# Patient Record
Sex: Male | Born: 1955 | Race: White | Hispanic: No | Marital: Single | Smoking: Never smoker
Health system: Southern US, Community
[De-identification: ages and names within clinical notes are randomized; demographics above are authoritative.]

## PROBLEM LIST (undated history)

## (undated) DIAGNOSIS — R739 Hyperglycemia, unspecified: Secondary | ICD-10-CM

## (undated) DIAGNOSIS — G40309 Generalized idiopathic epilepsy and epileptic syndromes, not intractable, without status epilepticus: Secondary | ICD-10-CM

## (undated) DIAGNOSIS — C210 Malignant neoplasm of anus, unspecified: Secondary | ICD-10-CM

## (undated) DIAGNOSIS — M810 Age-related osteoporosis without current pathological fracture: Secondary | ICD-10-CM

## (undated) DIAGNOSIS — E559 Vitamin D deficiency, unspecified: Secondary | ICD-10-CM

## (undated) DIAGNOSIS — I82409 Acute embolism and thrombosis of unspecified deep veins of unspecified lower extremity: Secondary | ICD-10-CM

## (undated) DIAGNOSIS — G40409 Other generalized epilepsy and epileptic syndromes, not intractable, without status epilepticus: Secondary | ICD-10-CM

## (undated) DIAGNOSIS — I10 Essential (primary) hypertension: Secondary | ICD-10-CM

## (undated) DIAGNOSIS — D649 Anemia, unspecified: Secondary | ICD-10-CM

## (undated) DIAGNOSIS — D519 Vitamin B12 deficiency anemia, unspecified: Secondary | ICD-10-CM

## (undated) DIAGNOSIS — G809 Cerebral palsy, unspecified: Secondary | ICD-10-CM

## (undated) HISTORY — DX: Vitamin D deficiency, unspecified: E55.9

## (undated) HISTORY — PX: EXCISIONAL HEMORRHOIDECTOMY: SHX1541

## (undated) HISTORY — DX: Malignant neoplasm of anus, unspecified: C21.0

## (undated) HISTORY — DX: Anemia, unspecified: D64.9

## (undated) HISTORY — DX: Cerebral palsy, unspecified: G80.9

## (undated) HISTORY — PX: COLONOSCOPY: SHX174

---
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
## 1986-09-01 HISTORY — PX: APPENDECTOMY: SHX54

## 2001-09-01 HISTORY — PX: INGUINAL HERNIA REPAIR: SUR1180

## 2008-09-01 DIAGNOSIS — C210 Malignant neoplasm of anus, unspecified: Secondary | ICD-10-CM

## 2008-09-01 HISTORY — DX: Malignant neoplasm of anus, unspecified: C21.0

## 2009-07-02 HISTORY — PX: RECTAL SURGERY: SHX760

## 2016-04-22 ENCOUNTER — Encounter (HOSPITAL_BASED_OUTPATIENT_CLINIC_OR_DEPARTMENT_OTHER): Payer: Self-pay

## 2016-04-22 ENCOUNTER — Emergency Department (HOSPITAL_BASED_OUTPATIENT_CLINIC_OR_DEPARTMENT_OTHER): Payer: Medicare (Managed Care)

## 2016-04-22 ENCOUNTER — Observation Stay (HOSPITAL_BASED_OUTPATIENT_CLINIC_OR_DEPARTMENT_OTHER)
Admission: EM | Admit: 2016-04-22 | Discharge: 2016-04-24 | Disposition: A | Discharge status: Home / Self Care | Payer: Medicare (Managed Care) | Attending: Internal Medicine | Admitting: Internal Medicine

## 2016-04-22 DIAGNOSIS — Z79899 Other long term (current) drug therapy: Secondary | ICD-10-CM | POA: Diagnosis not present

## 2016-04-22 DIAGNOSIS — Z8546 Personal history of malignant neoplasm of prostate: Secondary | ICD-10-CM | POA: Insufficient documentation

## 2016-04-22 DIAGNOSIS — Z8500 Personal history of malignant neoplasm of unspecified digestive organ: Secondary | ICD-10-CM | POA: Diagnosis not present

## 2016-04-22 DIAGNOSIS — M7989 Other specified soft tissue disorders: Secondary | ICD-10-CM | POA: Diagnosis present

## 2016-04-22 DIAGNOSIS — G40909 Epilepsy, unspecified, not intractable, without status epilepticus: Secondary | ICD-10-CM | POA: Insufficient documentation

## 2016-04-22 DIAGNOSIS — I82401 Acute embolism and thrombosis of unspecified deep veins of right lower extremity: Secondary | ICD-10-CM | POA: Diagnosis not present

## 2016-04-22 DIAGNOSIS — I82431 Acute embolism and thrombosis of right popliteal vein: Secondary | ICD-10-CM | POA: Diagnosis not present

## 2016-04-22 DIAGNOSIS — Z8489 Family history of other specified conditions: Secondary | ICD-10-CM | POA: Insufficient documentation

## 2016-04-22 DIAGNOSIS — Z7983 Long term (current) use of bisphosphonates: Secondary | ICD-10-CM | POA: Diagnosis not present

## 2016-04-22 DIAGNOSIS — I82409 Acute embolism and thrombosis of unspecified deep veins of unspecified lower extremity: Secondary | ICD-10-CM | POA: Insufficient documentation

## 2016-04-22 DIAGNOSIS — R569 Unspecified convulsions: Secondary | ICD-10-CM

## 2016-04-22 DIAGNOSIS — M810 Age-related osteoporosis without current pathological fracture: Secondary | ICD-10-CM | POA: Insufficient documentation

## 2016-04-22 DIAGNOSIS — I82411 Acute embolism and thrombosis of right femoral vein: Secondary | ICD-10-CM | POA: Diagnosis not present

## 2016-04-22 DIAGNOSIS — I10 Essential (primary) hypertension: Secondary | ICD-10-CM

## 2016-04-22 HISTORY — DX: Vitamin B12 deficiency anemia, unspecified: D51.9

## 2016-04-22 HISTORY — DX: Acute embolism and thrombosis of unspecified deep veins of unspecified lower extremity: I82.409

## 2016-04-22 HISTORY — DX: Other generalized epilepsy and epileptic syndromes, not intractable, without status epilepticus: G40.409

## 2016-04-22 HISTORY — DX: Hyperglycemia, unspecified: R73.9

## 2016-04-22 HISTORY — DX: Generalized idiopathic epilepsy and epileptic syndromes, not intractable, without status epilepticus: G40.309

## 2016-04-22 HISTORY — DX: Essential (primary) hypertension: I10

## 2016-04-22 HISTORY — DX: Age-related osteoporosis without current pathological fracture: M81.0

## 2016-04-22 LAB — PROTIME-INR
INR: 1.1
Prothrombin Time: 14.2 seconds (ref 11.4–15.2)

## 2016-04-22 LAB — CBC
HCT: 28.4 % — ABNORMAL LOW (ref 39.0–52.0)
Hemoglobin: 9.7 g/dL — ABNORMAL LOW (ref 13.0–17.0)
MCH: 33.2 pg (ref 26.0–34.0)
MCHC: 34.2 g/dL (ref 30.0–36.0)
MCV: 97.3 fL (ref 78.0–100.0)
PLATELETS: 333 10*3/uL (ref 150–400)
RBC: 2.92 MIL/uL — AB (ref 4.22–5.81)
RDW: 11.5 % (ref 11.5–15.5)
WBC: 5.2 10*3/uL (ref 4.0–10.5)

## 2016-04-22 LAB — APTT: APTT: 35 s (ref 24–36)

## 2016-04-22 LAB — OCCULT BLOOD X 1 CARD TO LAB, STOOL: FECAL OCCULT BLD: NEGATIVE

## 2016-04-22 LAB — BASIC METABOLIC PANEL
Anion gap: 5 (ref 5–15)
BUN: 17 mg/dL (ref 6–20)
CHLORIDE: 105 mmol/L (ref 101–111)
CO2: 31 mmol/L (ref 22–32)
Calcium: 7.9 mg/dL — ABNORMAL LOW (ref 8.9–10.3)
Creatinine, Ser: 0.77 mg/dL (ref 0.61–1.24)
GFR calc Af Amer: >60 mL/min (ref >60)
GFR calc non Af Amer: >60 mL/min (ref >60)
GLUCOSE: 105 mg/dL — AB (ref 65–99)
POTASSIUM: 3.8 mmol/L (ref 3.5–5.1)
SODIUM: 141 mmol/L (ref 135–145)

## 2016-04-22 MED ORDER — COUMADIN BOOK
Freq: Once | Status: AC
  Administered 2016-04-22: 1
  Filled 2016-04-22: qty 1

## 2016-04-22 MED ORDER — ONDANSETRON HCL 4 MG/2ML IJ SOLN: 4.0000 mg | Freq: Four times a day (QID) | INTRAMUSCULAR | Status: DC | PRN

## 2016-04-22 MED ORDER — ONDANSETRON HCL 4 MG PO TABS: 4.0000 mg | ORAL_TABLET | Freq: Four times a day (QID) | ORAL | Status: DC | PRN

## 2016-04-22 MED ORDER — ACETAMINOPHEN 650 MG RE SUPP: 650.0000 mg | Freq: Four times a day (QID) | RECTAL | Status: DC | PRN

## 2016-04-22 MED ORDER — PHENYTOIN SODIUM EXTENDED 100 MG PO CAPS
200.0000 mg | ORAL_CAPSULE | Freq: Two times a day (BID) | ORAL | Status: DC
  Administered 2016-04-23 – 2016-04-24 (×3): 200 mg via ORAL
  Filled 2016-04-22 (×3): qty 2

## 2016-04-22 MED ORDER — PHENYTOIN SODIUM EXTENDED 100 MG PO CAPS
100.0000 mg | ORAL_CAPSULE | Freq: Once | ORAL | Status: AC
  Administered 2016-04-22: 100 mg via ORAL
  Filled 2016-04-22: qty 1

## 2016-04-22 MED ORDER — WARFARIN SODIUM 7.5 MG PO TABS
7.5000 mg | ORAL_TABLET | Freq: Once | ORAL | Status: AC
  Administered 2016-04-22: 7.5 mg via ORAL
  Filled 2016-04-22: qty 1

## 2016-04-22 MED ORDER — HYDRALAZINE HCL 20 MG/ML IJ SOLN
5.0000 mg | INTRAMUSCULAR | Status: DC | PRN
  Administered 2016-04-22 – 2016-04-24 (×2): 5 mg via INTRAVENOUS
  Filled 2016-04-22 (×2): qty 1

## 2016-04-22 MED ORDER — SODIUM CHLORIDE 0.9% FLUSH
3.0000 mL | Freq: Two times a day (BID) | INTRAVENOUS | Status: DC
  Administered 2016-04-22 – 2016-04-24 (×4): 3 mL via INTRAVENOUS

## 2016-04-22 MED ORDER — WARFARIN VIDEO
Freq: Once | Status: AC
Start: 2016-04-22 — End: 2016-04-22
  Administered 2016-04-22: 1

## 2016-04-22 MED ORDER — VITAMIN B-12 1000 MCG PO TABS
1000.0000 ug | ORAL_TABLET | Freq: Every day | ORAL | Status: DC
  Administered 2016-04-22 – 2016-04-24 (×3): 1000 ug via ORAL
  Filled 2016-04-22 (×3): qty 1

## 2016-04-22 MED ORDER — LEVETIRACETAM 500 MG PO TABS
500.0000 mg | ORAL_TABLET | Freq: Two times a day (BID) | ORAL | Status: DC
  Administered 2016-04-22 – 2016-04-24 (×4): 500 mg via ORAL
  Filled 2016-04-22 (×4): qty 1

## 2016-04-22 MED ORDER — ATENOLOL 50 MG PO TABS
25.0000 mg | ORAL_TABLET | Freq: Every day | ORAL | Status: DC
  Administered 2016-04-23 – 2016-04-24 (×2): 25 mg via ORAL
  Filled 2016-04-22 (×2): qty 1

## 2016-04-22 MED ORDER — WARFARIN - PHARMACIST DOSING INPATIENT
Freq: Every day | Status: DC
Start: 2016-04-22 — End: 2016-04-24
  Administered 2016-04-23: 18:00:00

## 2016-04-22 MED ORDER — PHENYTOIN SODIUM EXTENDED 100 MG PO CAPS
100.0000 mg | ORAL_CAPSULE | Freq: Three times a day (TID) | ORAL | Status: DC
  Administered 2016-04-22: 100 mg via ORAL
  Filled 2016-04-22: qty 1

## 2016-04-22 MED ORDER — ENOXAPARIN SODIUM 60 MG/0.6ML ~~LOC~~ SOLN
1.0000 mg/kg | Freq: Two times a day (BID) | SUBCUTANEOUS | Status: DC
  Administered 2016-04-22 – 2016-04-23 (×2): 60 mg via SUBCUTANEOUS
  Filled 2016-04-22 (×2): qty 0.6

## 2016-04-22 MED ORDER — ACETAMINOPHEN 325 MG PO TABS
650.0000 mg | ORAL_TABLET | Freq: Four times a day (QID) | ORAL | Status: DC | PRN
Start: 2016-04-22 — End: 2016-04-24
  Administered 2016-04-22: 650 mg via ORAL
  Filled 2016-04-22: qty 2

## 2016-04-22 NOTE — Progress Notes (Signed)
ANTICOAGULATION CONSULT NOTE - Initial Consult  Pharmacy Consult for enoxaparin Indication: DVT  No Known Allergies  Patient Measurements: Height: 5\' 9"  (175.3 cm) Weight: 132 lb (59.9 kg) IBW/kg (Calculated) : 70.7   Vital Signs: Temp: 97.9 F (36.6 C) (08/22 0939) Temp Source: Oral (08/22 0939) BP: 146/76 (08/22 0939) Pulse Rate: 66 (08/22 0939)  Labs:  Recent Labs  04/22/16 1145  HGB 9.7*  HCT 28.4*  PLT 333  APTT 35  LABPROT 14.2  INR 1.10  CREATININE 0.77    Estimated Creatinine Clearance: 85.3 mL/min (by C-G formula based on SCr of 0.8 mg/dL).  Assessment: 60 yo m presenting to [facility] with swelling to left and foot since sat - + for DVT  PMH: hx of prostate ca, HTN  AC: none pta - enox for new dvt  Renal: SCr 0.77  Heme: H&H 9.7/28.4, plt 333   Plan:  Enoxaparin 1 mg/kg q12h Monitor renal fx, cbc Long term AC plan?  Levester Fresh, PharmD, BCPS, [facility] Clinical Pharmacist Pager [phone] 04/22/2016 12:28 PM

## 2016-04-22 NOTE — ED Notes (Signed)
Patient returned from [place].

## 2016-04-22 NOTE — Progress Notes (Signed)
   04/22/16 2234  Vitals  BP (!) 168/84  MAP (mmHg) 106  BP Location Left Arm  BP Method Automatic  Patient Position (if appropriate) Lying  Pulse Rate 75  Pulse Rate Source Monitor  Oxygen Therapy  SpO2 100 %   NP made aware of BP. Awaiting orders

## 2016-04-22 NOTE — ED Triage Notes (Signed)
Patient noticed swelling to right leg and foot that started on Saturday night. Patient states swelling is getting worse and throbbing in knee.

## 2016-04-22 NOTE — ED Notes (Signed)
Transported to [place] at this time

## 2016-04-22 NOTE — ED Provider Notes (Signed)
[facility] DEPT [facility] Provider Note   CSN: [id number] Arrival date & time: 04/22/16  [id number]     History   Chief Complaint Chief Complaint  Patient presents with  . Leg Swelling    HPI Rick Walsh is a 60 y.o. male.  Patient presents to the emergency department with chief complaint of right lower extremity pain and swelling. He states that he noticed the swelling on Saturday night. He reports increased pain with ambulation and with palpation. He states that most of the pain is over the ankle and the calf. It is tender to palpation over the posterior calf. Patient denies any injuries. He states that he was walking a lot on Saturday night, but otherwise is uncertain as to what may have happened. Patient does report having a recent flight from [place] about 4 weeks ago. There are no other associated symptoms. He denies any chest pain or shortness breath. Denies any fevers, chills, nausea, or vomiting.   The history is provided by the patient. No language interpreter was used.    Past Medical History:  Diagnosis Date  . Cancer ([place])    hx prostate cancer  . Epilepsy ([place])   . Hypertension   . Osteoporosis     There are no active problems to display for this patient.   Past Surgical History:  Procedure Laterality Date  . ABDOMINAL SURGERY    . CHOLECYSTECTOMY         Home Medications    Prior to Admission medications   Medication Sig Start Date End Date Taking? Authorizing Provider  alendronate (FOSAMAX) 70 MG tablet Take 70 mg by mouth once a week. Take with a full glass of water on an empty stomach.   Yes Historical Provider, MD  atenolol (TENORMIN) 25 MG tablet Take by mouth daily.   Yes Historical Provider, MD  cyanocobalamin 1000 MCG tablet Take 1,000 mcg by mouth daily.   Yes Historical Provider, MD  levETIRAcetam (KEPPRA) 500 MG tablet Take 500 mg by mouth 2 (two) times daily.   Yes Historical Provider, MD  phenytoin (DILANTIN) 100 MG ER capsule Take 100 mg by  mouth 3 (three) times daily.   Yes Historical Provider, MD    Family History History reviewed. No pertinent family history.  Social History Social History  Substance Use Topics  . Smoking status: Never Smoker  . Smokeless tobacco: Never Used  . Alcohol use No     Allergies   Review of patient's allergies indicates no known allergies.   Review of Systems Review of Systems  Musculoskeletal: Positive for myalgias.  All other systems reviewed and are negative.    Physical Exam Updated Vital Signs BP 146/76 (BP Location: Left Arm)   Pulse 66   Temp 97.9 F (36.6 C) (Oral)   Resp 18   Ht 5\' 9"  (1.753 m)   Wt 59.9 kg   SpO2 99%   BMI 19.49 kg/m   Physical Exam  Constitutional: He is oriented to person, place, and time. He appears well-developed and well-nourished.  HENT:  Head: Normocephalic and atraumatic.  Eyes: Conjunctivae and EOM are normal. Pupils are equal, round, and reactive to light. Right eye exhibits no discharge. Left eye exhibits no discharge. No scleral icterus.  Neck: Normal range of motion. Neck supple. No JVD present.  Cardiovascular: Normal rate, regular rhythm and normal heart sounds.  Exam reveals no gallop and no friction rub.   No murmur heard. Pulmonary/Chest: Effort normal and breath sounds normal. No respiratory distress.  He has no wheezes. He has no rales. He exhibits no tenderness.  Abdominal: Soft. He exhibits no distension and no mass. There is no tenderness. There is no rebound and no guarding.  Musculoskeletal: Normal range of motion. He exhibits no edema or tenderness.  Range of motion strength of right ankle is 5/5, no bony or moderate deformity Right calf is moderately tender to palpation over the body of the gastrocnemius Right knee range of motion strength 5/5  Neurological: He is alert and oriented to person, place, and time.  Skin: Skin is warm and dry.  Psychiatric: He has a normal mood and affect. His behavior is normal.  Judgment and thought content normal.  Nursing note and vitals reviewed.    ED Treatments / Results  Labs (all labs ordered are listed, but only abnormal results are displayed) Labs Reviewed - No data to display  EKG  EKG Interpretation None       Radiology No results found.  Procedures Procedures (including critical care time)  Medications Ordered in ED Medications - No data to display   Initial Impression / Assessment and Plan / ED Course  I have reviewed the triage vital signs and the nursing notes.  Pertinent labs & imaging results that were available during my care of the patient were reviewed by me and considered in my medical decision making (see chart for details).  Clinical Course    Patient with right lower extremity swelling, recent travel from [place], will check ultrasound to rule out DVT. No chest pain or shortness breath. No trauma. Patient is ambulatory.  11:35 AM Lower extremity Doppler ultrasound is consistent with extensive acute occlusive DVT in the for moral and popliteal veins. Lovenox. Patient has poor follow-up, and is "mentally challenged" per the family member.  Final Clinical Impressions(s) / ED Diagnoses   Final diagnoses:  DVT (deep venous thrombosis), right    New Prescriptions New Prescriptions   No medications on file     Montine Circle, PA-C 04/22/16 Oct[place], MD 04/23/16 [phone]

## 2016-04-22 NOTE — ED Notes (Signed)
Jane, radiology phones this rn to report that pt has + dvt, PA Marlon Pel states he is aware and has seen the films.

## 2016-04-22 NOTE — H&P (Addendum)
History and Physical  [name] [id number] DOB: 1955-11-23 DOA: 04/22/2016   PCP: No PCP Per Patient   Patient coming from: Home  Chief Complaint: right leg pain  HPI:  Rick Walsh is a 60 y.o. male with medical history of hypertension, seizure disorder, GI cancer, cognitive impairment presented with approximately one-week history of cramping and pain about his right calf and ankle. The patient felt like he twisted his ankle on 04/11/2016 while working in yard. He did not pay too much attention to it for the next several days. However, on 04/16/2016, he began noticing cramping and pain in his right calf and right ankle. His sister sprayed some "biofreeze" onto the right. However over the next 24-48 hours there was no improvement. He noticed increasing swelling and pain in his right leg over the next 2-3 days. As a result, he presented to the emergency department for further evaluation. Notably, the patient recently flew back to [place] from [place] on 03/19/2016. He denies any recent injury or surgeries. There is no personal history of VTE, but there is family history of blood clots and brother and sisters. He denies any fevers, chills, chest discomfort, short of breath, nausea, vomiting, diarrhea, abdominal pain, dysuria, hematuria. Denies headache or dizziness. There is no syncope. In the emergency room, the patient was afebrile and hemodynamically stable. Venous duplex of right lower extension May showed extensive DVT in the right femoral, right popliteal, and right calf veins. Because the patient has cognitive impairment and does not have a primary provider presently, the decision was made to admit the patient for coordination of care and to start anticoagulation.  Assessment/Plan: Acute DVT right lower extremity -Patient was given enoxaparin in the emergency department -Appears unprovoked in a patient with previous history of some type of GI cancer-in remission;  this was  resected in 2010. He did not require any chemotherapy or radiation -discussed with pharmacy-->major drug-drug interaction with NOACs and dilantin-->will need to use coumadin -pt will need enoxaparin bridge with warfarin -will need care management to assist with finding PCP follow up  Hypertension -Continue atenolol  Seizure disorder -Continue Keppra and Dilantin         Past Medical History:  Diagnosis Date  . Cancer ([place])    hx prostate cancer  . Epilepsy ([place])   . Hypertension   . Osteoporosis    Past Surgical History:  Procedure Laterality Date  . ABDOMINAL SURGERY    . CHOLECYSTECTOMY     Social History:  reports that he has never smoked. He has never used smokeless tobacco. He reports that he does not drink alcohol or use drugs.   History reviewed. No pertinent family history.   No Known Allergies   Prior to Admission medications   Medication Sig Start Date End Date Taking? Authorizing Provider  alendronate (FOSAMAX) 70 MG tablet Take 70 mg by mouth once a week. Take with a full glass of water on an empty stomach.   Yes Historical Provider, MD  atenolol (TENORMIN) 25 MG tablet Take by mouth daily.   Yes Historical Provider, MD  cyanocobalamin 1000 MCG tablet Take 1,000 mcg by mouth daily.   Yes Historical Provider, MD  levETIRAcetam (KEPPRA) 500 MG tablet Take 500 mg by mouth 2 (two) times daily.   Yes Historical Provider, MD  phenytoin (DILANTIN) 100 MG ER capsule Take 100 mg by mouth 3 (three) times daily.   Yes Historical Provider, MD    Review of Systems:  Constitutional:  No weight loss, night sweats, Fevers, chills, fatigue.  Head&Eyes: No headache.  No vision loss.  No eye pain or scotoma ENT:  No Difficulty swallowing,Tooth/dental problems,Sore throat,  No ear ache, post nasal drip,  Cardio-vascular:  No chest pain, Orthopnea, PND, swelling in lower extremities,  dizziness, palpitations  GI:  No  abdominal pain, nausea, vomiting, diarrhea, loss  of appetite, hematochezia, melena, heartburn, indigestion, Resp:  No shortness of breath with exertion or at rest. No cough. No coughing up of blood .No wheezing.No chest wall deformity  Skin:  no rash or lesions.  GU:  no dysuria, change in color of urine, no urgency or frequency. No flank pain.  Musculoskeletal:  No joint pain or swelling. No decreased range of motion. No back pain.  Psych:  No change in mood or affect. No depression or anxiety. Neurologic: No headache, no dysesthesia, no focal weakness, no vision loss. No syncope  Physical Exam: Vitals:   04/22/16 0939 04/22/16 1415 04/22/16 1452 04/22/16 1550  BP: 146/76 148/79 147/82 (!) 150/86  Pulse: 66 (!) 56 (!) 57 64  Resp: 18 18  20   Temp: 97.9 F (36.6 C)   97.8 F (36.6 C)  TempSrc: Oral     SpO2: 99% 100% 100% 100%  Weight: 59.9 kg (132 lb)     Height: 5\' 9"  (1.753 m)      General:  A&O x 3, NAD, nontoxic, pleasant/cooperative Head/Eye: No conjunctival hemorrhage, no icterus, [place]/AT, No nystagmus ENT:  No icterus,  No thrush, good dentition, no pharyngeal exudate Neck:  No masses, no lymphadenpathy, no bruits CV:  RRR, no rub, no gallop, no S3 Lung:  CTAB, good air movement, no wheeze, no rhonchi Abdomen: soft/NT, +BS, nondistended, no peritoneal signs Ext: No cyanosis, No rashes, No petechiae, No lymphangitis, 2+ RLE edema Neuro: CNII-XII intact, strength 4/5 in bilateral upper and lower extremities, no dysmetria  Labs on Admission:  Basic Metabolic Panel:  Recent Labs Lab 04/22/16 1145  NA 141  K 3.8  CL 105  CO2 31  GLUCOSE 105*  BUN 17  CREATININE 0.77  CALCIUM 7.9*   Liver Function Tests: No results for input(s): AST, ALT, ALKPHOS, BILITOT, PROT, ALBUMIN in the last 168 hours. No results for input(s): LIPASE, AMYLASE in the last 168 hours. No results for input(s): AMMONIA in the last 168 hours. CBC:  Recent Labs Lab 04/22/16 1145  WBC 5.2  HGB 9.7*  HCT 28.4*  MCV 97.3  PLT 333    Coagulation Profile:  Recent Labs Lab 04/22/16 1145  INR 1.10   Cardiac Enzymes: No results for input(s): CKTOTAL, CKMB, CKMBINDEX, TROPONINI in the last 168 hours. BNP: Invalid input(s): POCBNP CBG: No results for input(s): GLUCAP in the last 168 hours. Urine analysis: No results found for: COLORURINE, APPEARANCEUR, LABSPEC, PHURINE, GLUCOSEU, HGBUR, BILIRUBINUR, KETONESUR, PROTEINUR, UROBILINOGEN, NITRITE, LEUKOCYTESUR Sepsis Labs: @LABRCNTIP (procalcitonin:4,lacticidven:4) )No results found for this or any previous visit (from the past 240 hour(s)).   Radiological Exams on Admission: US Venous Img Lower Unilateral Right  Result Date: 04/22/2016 CLINICAL DATA:  Acute right lower extremity swelling and pain for 3 days EXAM: RIGHT LOWER EXTREMITY VENOUS DOPPLER ULTRASOUND TECHNIQUE: Gray-scale sonography with graded compression, as well as color Doppler and duplex ultrasound were performed to evaluate the lower extremity deep venous systems from the level of the common femoral vein and including the common femoral, femoral, profunda femoral, popliteal and calf veins including the posterior tibial, peroneal and gastrocnemius veins when visible. The superficial great  saphenous vein was also interrogated. Spectral Doppler was utilized to evaluate flow at rest and with distal augmentation maneuvers in the common femoral, femoral and popliteal veins. COMPARISON:  None. FINDINGS: Contralateral Common Femoral Vein: Acute hypoechoic intraluminal thrombus within the common femoral vein appearing adherent to a valve, with some mobility noted during respiratory phasicity. Thrombus is nonocclusive at this level with partial compressibility noted. Common Femoral Vein: Acute hypoechoic intraluminal thrombus. Partially occlusive and partially compressible. Saphenofemoral Junction: Small amount of thrombus extends into the SFJ. Profunda Femoral Vein: No evidence of thrombus. Normal compressibility and flow  on color Doppler imaging. Femoral Vein: Diffuse acute hypoechoic intraluminal thrombus. Thrombus appears occlusive. Vessel is noncompressible. No detectable flow. Popliteal Vein: Similar diffuse hypoechoic intraluminal thrombus appearing acute and occlusive. Vessel is noncompressible. No detectable flow. Calf Veins: Thrombus does appear to propagate into the tibial and peroneal calf veins as well as the gastrocnemius vein. Superficial Great Saphenous Vein: No evidence of thrombus. Normal compressibility and flow on color Doppler imaging. Venous Reflux:  None. Other Findings:  None. IMPRESSION: Extensive acute occlusive DVT within the right femoral, popliteal and calf veins. These results will be called to the ordering clinician or representative by the Radiologist Assistant, and communication documented in the PACS or zVision Dashboard. Electronically Signed   By: Jerilynn Mages.  Shick M.D.   On: 04/22/2016 11:18        Time spent:60 minutes Code Status:   FULL Family Communication:  Brother and sister at bedside Disposition Plan: expect 1day hospitalization Consults called: none DVT Prophylaxis:  Coumadin/lovenox  Berdine Rasmusson, DO  [facility] Pager [phone]  If 7PM-7AM, please contact night-coverage [email or web address] Password [facility] 04/22/2016, 4:59 PM

## 2016-04-22 NOTE — Plan of Care (Signed)
60 yo M with Mental disabity presents with right leg swelling no SOB baseline anemia, hx seizures originally from [place] found large DVT started on Lovenox.  Asked for hemoccult  Accepted for med surge Bed obs needs case managment  Rick Walsh 1:00 PM

## 2016-04-22 NOTE — Progress Notes (Signed)
ANTICOAGULATION CONSULT NOTE - Initial Consult  Pharmacy Consult for enoxaparin> apixiban Indication: DVT  No Known Allergies  Patient Measurements: Height: 5\' 9"  (175.3 cm) Weight: 132 lb (59.9 kg) IBW/kg (Calculated) : 70.7   Vital Signs: Temp: 97.8 F (36.6 C) (08/22 1550) Temp Source: Oral (08/22 0939) BP: 150/86 (08/22 1550) Pulse Rate: 64 (08/22 1550)  Labs:  Recent Labs  04/22/16 1145  HGB 9.7*  HCT 28.4*  PLT 333  APTT 35  LABPROT 14.2  INR 1.10  CREATININE 0.77    Estimated Creatinine Clearance: 85.3 mL/min (by C-G formula based on SCr of 0.8 mg/dL).  Assessment: 60 yo m presenting to [facility] with swelling to left and foot since sat - + for DVT on on lovenox. Pharmacy consulted to transition to Coumadin, Unfortunately the patient is not a candidate for any DOAC (apixiban, rixaroxaban, pradaxa, edoxaban) due to concurrent phenytoin (phenytoin results in concentrations of all DOACs) -Hg= 9.7, plt= 333 -baseline INR= 1.1    Plan:  -Coumadin 7.5mg  po today -Continuing lovenox for a minimum of 5 days overlap -Daily PT/INR   Hildred Laser, Pharm D 04/22/2016 5:05 PM

## 2016-04-22 NOTE — Progress Notes (Signed)
Patient arrived on the floor from [place] via [place] to [phone]; alert and oriented x 4;  complaints of pain in right lower extremity with ambulation; IV saline locked in RAC; skin intact. Orient patient to room and unit; gave patient care guide; instructed how to use the call bell and  fall risk precautions. Will continue to monitor the patient.

## 2016-04-23 ENCOUNTER — Telehealth: Payer: Self-pay

## 2016-04-23 DIAGNOSIS — R569 Unspecified convulsions: Secondary | ICD-10-CM | POA: Diagnosis not present

## 2016-04-23 DIAGNOSIS — I82401 Acute embolism and thrombosis of unspecified deep veins of right lower extremity: Secondary | ICD-10-CM | POA: Diagnosis not present

## 2016-04-23 DIAGNOSIS — I82431 Acute embolism and thrombosis of right popliteal vein: Secondary | ICD-10-CM | POA: Diagnosis not present

## 2016-04-23 DIAGNOSIS — I10 Essential (primary) hypertension: Secondary | ICD-10-CM

## 2016-04-23 LAB — CBC
HCT: 30.2 % — ABNORMAL LOW (ref 39.0–52.0)
Hemoglobin: 9.7 g/dL — ABNORMAL LOW (ref 13.0–17.0)
MCH: 31.3 pg (ref 26.0–34.0)
MCHC: 32.1 g/dL (ref 30.0–36.0)
MCV: 97.4 fL (ref 78.0–100.0)
PLATELETS: 338 10*3/uL (ref 150–400)
RBC: 3.1 MIL/uL — AB (ref 4.22–5.81)
RDW: 12.3 % (ref 11.5–15.5)
WBC: 5.6 10*3/uL (ref 4.0–10.5)

## 2016-04-23 LAB — PROTIME-INR
INR: 1.05
Prothrombin Time: 13.7 seconds (ref 11.4–15.2)

## 2016-04-23 MED ORDER — WARFARIN SODIUM 7.5 MG PO TABS
7.5000 mg | ORAL_TABLET | Freq: Once | ORAL | Status: AC
  Administered 2016-04-23: 7.5 mg via ORAL
  Filled 2016-04-23: qty 1

## 2016-04-23 MED ORDER — ENOXAPARIN SODIUM 100 MG/ML ~~LOC~~ SOLN
1.5000 mg/kg | SUBCUTANEOUS | Status: DC
  Administered 2016-04-23 – 2016-04-24 (×2): 90 mg via SUBCUTANEOUS
  Filled 2016-04-23 (×2): qty 1

## 2016-04-23 NOTE — Telephone Encounter (Signed)
Rick Walsh - Case Manager for [facility] [phone]  She called in to schedule an np appt on pt's behalf. She says that pt's sister is a current pt Rick Walsh) she is hoping that her pcp (Dr B) could take her brother on as a new patient also?   Informed that provider isn't currently accepting np.     Please advise.

## 2016-04-23 NOTE — Progress Notes (Signed)
PROGRESS NOTE    Rick Walsh  [id number] DOB: Nov 24, 1955 DOA: 04/22/2016 PCP: Pcp Not In System    Brief Narrative:  Patient is a 60 year old gentleman history of hypertension, seizure disorder, prior history of GI malignancy status post resection in 2010 did not require any chemotherapy or radiation presented with a one-week history of right lower extremity cramping and pain. Lower extremity Dopplers positive for DVT. Patient lives half the [place] and half the year [place] recently returned from [place]. Patient also noted with a family history of blood clots.   Assessment & Plan:   Active Problems:   DVT (deep venous thrombosis), right   Leg DVT (deep venous thromboembolism), acute (HCC)  #1 acute extensive occlusive right femoral, popliteal, and calf veins DVT  patient states he spends half the year in [place] and the other half in [place] recently returned from [place]. Patient with no prior history of DVT however per admitting physician patient with family history of DVT. Concern for unprovoked DVT in patient with some previous history of some type of GI cancer now in remission status post resection 2010. Patient did not require any chemotherapy or radiation therapy. Due to interaction between no ox and Dilantin patient will need to be on Coumadin for anticoagulation. Continue Lovenox bridge with warfarin. Patient need a PCP and will need PT/INR checked post discharge. Patient wanted to follow-up with an M.D. post discharge. Patient will likely need to follow-up with a hematologist in several months for further workup for hypercoagulable state.  #2 hypertension Stable. Continue atenolol.  #3 seizure disorder Stable. No noted seizures during this hospitalization. Continue home regimen of Keppra and Dilantin.     DVT prophylaxis: Lovenox Code Status: Full Family Communication: Updated patient. No family at bedside. Disposition Plan: Home when medically  stable and patient's family able to comfortably administer Lovenox bridge with Coumadin.   Consultants:   None  Procedures:   Bilateral lower extremity Dopplers 04/22/2016  Antimicrobials:   None   Subjective: Patient states right cramping leg pain improved since admission. No chest pain. No shortness of breath.  Objective: Vitals:   04/22/16 2233 04/22/16 2234 04/22/16 2348 04/23/16 0605  BP: (!) 182/88 (!) 168/84 123/66 134/75  Pulse: 79 75 67 (!) 58  Resp: 19   16  Temp: 98.4 F (36.9 C)   97.5 F (36.4 C)  TempSrc:    Oral  SpO2: 100% 100%  100%  Weight:      Height:       No intake or output data in the 24 hours ending 04/23/16 1408 Filed Weights   04/22/16 0939  Weight: 59.9 kg (132 lb)    Examination:  General exam: Appears calm and comfortable  Respiratory system: Clear to auscultation. Respiratory effort normal. Cardiovascular system: S1 & S2 heard, RRR. No JVD, murmurs, rubs, gallops or clicks.  Gastrointestinal system: Abdomen is nondistended, soft and nontender. No organomegaly or masses felt. Normal bowel sounds heard. Central nervous system: Alert and oriented. No focal neurological deficits. Extremities: Right lower extremity more swollen  than left lower extremity. Right calf nontender to palpation. Skin: No rashes, lesions or ulcers Psychiatry: Judgement and insight appear fair. Mood & affect appropriate.     Data Reviewed: I have personally reviewed following labs and imaging studies  CBC:  Recent Labs Lab 04/22/16 1145 04/23/16 0526  WBC 5.2 5.6  HGB 9.7* 9.7*  HCT 28.4* 30.2*  MCV 97.3 97.4  PLT 333 [id number]   Basic Metabolic  Panel:  Recent Labs Lab 04/22/16 1145  NA 141  K 3.8  CL 105  CO2 31  GLUCOSE 105*  BUN 17  CREATININE 0.77  CALCIUM 7.9*   GFR: Estimated Creatinine Clearance: 85.3 mL/min (by C-G formula based on SCr of 0.8 mg/dL). Liver Function Tests: No results for input(s): AST, ALT, ALKPHOS, BILITOT, PROT,  ALBUMIN in the last 168 hours. No results for input(s): LIPASE, AMYLASE in the last 168 hours. No results for input(s): AMMONIA in the last 168 hours. Coagulation Profile:  Recent Labs Lab 04/22/16 1145 04/23/16 0526  INR 1.10 1.05   Cardiac Enzymes: No results for input(s): CKTOTAL, CKMB, CKMBINDEX, TROPONINI in the last 168 hours. BNP (last 3 results) No results for input(s): PROBNP in the last 8760 hours. HbA1C: No results for input(s): HGBA1C in the last 72 hours. CBG: No results for input(s): GLUCAP in the last 168 hours. Lipid Profile: No results for input(s): CHOL, HDL, LDLCALC, TRIG, CHOLHDL, LDLDIRECT in the last 72 hours. Thyroid Function Tests: No results for input(s): TSH, T4TOTAL, FREET4, T3FREE, THYROIDAB in the last 72 hours. Anemia Panel: No results for input(s): VITAMINB12, FOLATE, FERRITIN, TIBC, IRON, RETICCTPCT in the last 72 hours. Sepsis Labs: No results for input(s): PROCALCITON, LATICACIDVEN in the last 168 hours.  No results found for this or any previous visit (from the past 240 hour(s)).       Radiology Studies: US Venous Img Lower Unilateral Right  Result Date: 04/22/2016 CLINICAL DATA:  Acute right lower extremity swelling and pain for 3 days EXAM: RIGHT LOWER EXTREMITY VENOUS DOPPLER ULTRASOUND TECHNIQUE: Gray-scale sonography with graded compression, as well as color Doppler and duplex ultrasound were performed to evaluate the lower extremity deep venous systems from the level of the common femoral vein and including the common femoral, femoral, profunda femoral, popliteal and calf veins including the posterior tibial, peroneal and gastrocnemius veins when visible. The superficial great saphenous vein was also interrogated. Spectral Doppler was utilized to evaluate flow at rest and with distal augmentation maneuvers in the common femoral, femoral and popliteal veins. COMPARISON:  None. FINDINGS: Contralateral Common Femoral Vein: Acute hypoechoic  intraluminal thrombus within the common femoral vein appearing adherent to a valve, with some mobility noted during respiratory phasicity. Thrombus is nonocclusive at this level with partial compressibility noted. Common Femoral Vein: Acute hypoechoic intraluminal thrombus. Partially occlusive and partially compressible. Saphenofemoral Junction: Small amount of thrombus extends into the SFJ. Profunda Femoral Vein: No evidence of thrombus. Normal compressibility and flow on color Doppler imaging. Femoral Vein: Diffuse acute hypoechoic intraluminal thrombus. Thrombus appears occlusive. Vessel is noncompressible. No detectable flow. Popliteal Vein: Similar diffuse hypoechoic intraluminal thrombus appearing acute and occlusive. Vessel is noncompressible. No detectable flow. Calf Veins: Thrombus does appear to propagate into the tibial and peroneal calf veins as well as the gastrocnemius vein. Superficial Great Saphenous Vein: No evidence of thrombus. Normal compressibility and flow on color Doppler imaging. Venous Reflux:  None. Other Findings:  None. IMPRESSION: Extensive acute occlusive DVT within the right femoral, popliteal and calf veins. These results will be called to the ordering clinician or representative by the Radiologist Assistant, and communication documented in the PACS or zVision Dashboard. Electronically Signed   By: Jerilynn Mages.  Shick M.D.   On: 04/22/2016 11:18        Scheduled Meds: . atenolol  25 mg Oral Daily  . enoxaparin (LOVENOX) injection  1.5 mg/kg Subcutaneous Q24H  . levETIRAcetam  500 mg Oral BID  . phenytoin  200  mg Oral BID  . sodium chloride flush  3 mL Intravenous Q12H  . cyanocobalamin  1,000 mcg Oral Daily  . warfarin  7.5 mg Oral ONCE-1800  . Warfarin - Pharmacist Dosing Inpatient   Does not apply q1800   Continuous Infusions:    LOS: 0 days    Time spent: 90 minutes    THOMPSON,DANIEL, MD [facility] Pager [phone]  If 7PM-7AM, please contact  night-coverage [email or web address] Password [email or web address] 04/23/2016, 2:08 PM

## 2016-04-23 NOTE — Progress Notes (Signed)
LOVENOX PRICE CHECK   1. ENOXAPARIN 90 MG SUB Q DAILY ( 30 )  COVER- YES  CO-PAY- $ 42.00 / $ 47.00  TIER- 3 DRUG  PRIOR APPROVAL - NO  PHARMACY : [facility], [facility], [facility]

## 2016-04-23 NOTE — Telephone Encounter (Signed)
I am willing to take family members but do not have a space in the near future

## 2016-04-23 NOTE — Progress Notes (Signed)
Scheduled apt with Dr Raoul Pitch at [facility] in [place] on Monday Aug 28th at 10:00 for INR check and new patient appointment

## 2016-04-23 NOTE — Progress Notes (Signed)
Spoke with patient's sister Jackelyn Poling. She stated that her brother Mikki Santee will be here this afternoon to learn how to do Lovenox injections. Bostyn Kunkler states Mikki Santee will be administering them at home as he is a diabetic and has experience with injections.

## 2016-04-23 NOTE — Progress Notes (Addendum)
ANTICOAGULATION CONSULT NOTE - [place] for enoxaparin> warfarin Indication: DVT  Allergies  Allergen Reactions  . Phenytoin Sodium Nausea And Vomiting and Rash    Pt states generic phenytoin allergy. Takes brand dilantin    Patient Measurements: Height: 5\' 9"  (175.3 cm) Weight: 132 lb (59.9 kg) IBW/kg (Calculated) : 70.7   Vital Signs: Temp: 97.5 F (36.4 C) (08/23 0605) Temp Source: Oral (08/23 0605) BP: 134/75 (08/23 0605) Pulse Rate: 58 (08/23 0605)  Labs:  Recent Labs  04/22/16 1145 04/23/16 0526  HGB 9.7* 9.7*  HCT 28.4* 30.2*  PLT 333 338  APTT 35  --   LABPROT 14.2 13.7  INR 1.10 1.05  CREATININE 0.77  --     Estimated Creatinine Clearance: 85.3 mL/min (by C-G formula based on SCr of 0.8 mg/dL).  Assessment: 60 yo m presenting to [facility] with swelling to leg and foot since Saturday.  Found to have new DVT.  Patient started on Lovenox bridge to Coumadin 8/22.  Today is day #2 of 5d minimum overlap therapy.  Currently on Lovenox 1mg /kg q12h.  Will change dose to 1.5mg /kg q24h for ease of administration at home in the event patient is discharge home on Lovenox.  Unfortunately the patient is not a candidate for any DOAC (apixiban, rixaroxaban, pradaxa, edoxaban) due to concurrent phenytoin (phenytoin results in concentrations of all DOACs)   Plan:  Repeat Coumadin 7.5mg  po today Lovenox 90 mg SQ q24h - next dose due today at 1600. Continue lovenox for a minimum of 5 days overlap Daily PT/INR  [employer], Pharm.D., BCPS Clinical Pharmacist Pager [phone] 04/23/2016 11:23 AM

## 2016-04-23 NOTE — Care Management Note (Signed)
Case Management Note  Patient Details  Name: Rick Walsh MRN: [id number] Date of Birth: [birth year]  Subjective/Objective:                 Spoke with patient at the bedside. He states that he lives in [place] half the year and [place] half the year. He is living with his brother and sister in [place]. 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 He was able to provide a [employer] card. CM reported this to [employer] Counsel[employer] to update record, currently he shows no insurance. He has a PCP Dr. Drucie Opitz in [place], but does not have care established in [place]. Patient will need INR checks after DC, not stated in CM consult when first check will need to be.    Action/Plan:  Patient will need insurance updated, INR check scheduled, benefir check for Lovenox.   Expected Discharge Date:                  Expected Discharge Plan:  Home/Self Care  In-House Referral:  NA  Discharge planning Services  CM Consult  Post Acute Care Choice:  NA Choice offered to:  NA  DME Arranged:  N/A DME Agency:  NA  HH Arranged:  NA HH Agency:  NA  Status of Service:  In process, will continue to follow  If discussed at Long Length of Stay Meetings, dates discussed:    Additional Comments:  Carles Collet, RN 04/23/2016, 12:44 PM

## 2016-04-24 DIAGNOSIS — I82431 Acute embolism and thrombosis of right popliteal vein: Secondary | ICD-10-CM

## 2016-04-24 DIAGNOSIS — R569 Unspecified convulsions: Secondary | ICD-10-CM | POA: Diagnosis not present

## 2016-04-24 DIAGNOSIS — I10 Essential (primary) hypertension: Secondary | ICD-10-CM | POA: Diagnosis not present

## 2016-04-24 LAB — BASIC METABOLIC PANEL
ANION GAP: 8 (ref 5–15)
BUN: 14 mg/dL (ref 6–20)
CALCIUM: 8.7 mg/dL — AB (ref 8.9–10.3)
CO2: 31 mmol/L (ref 22–32)
CREATININE: 0.77 mg/dL (ref 0.61–1.24)
Chloride: 102 mmol/L (ref 101–111)
GFR calc non Af Amer: >60 mL/min (ref >60)
GLUCOSE: 97 mg/dL (ref 65–99)
POTASSIUM: 3.5 mmol/L (ref 3.5–5.1)
Sodium: 141 mmol/L (ref 135–145)

## 2016-04-24 LAB — CBC
HCT: 32.5 % — ABNORMAL LOW (ref 39.0–52.0)
Hemoglobin: 10.6 g/dL — ABNORMAL LOW (ref 13.0–17.0)
MCH: 31.7 pg (ref 26.0–34.0)
MCHC: 32.6 g/dL (ref 30.0–36.0)
MCV: 97.3 fL (ref 78.0–100.0)
PLATELETS: 381 10*3/uL (ref 150–400)
RBC: 3.34 MIL/uL — ABNORMAL LOW (ref 4.22–5.81)
RDW: 12.5 % (ref 11.5–15.5)
WBC: 6.1 10*3/uL (ref 4.0–10.5)

## 2016-04-24 LAB — PROTIME-INR
INR: 1.35
PROTHROMBIN TIME: 16.7 s — AB (ref 11.4–15.2)

## 2016-04-24 MED ORDER — ENOXAPARIN SODIUM 30 MG/0.3ML ~~LOC~~ SOLN: 1.5000 mg/kg | SUBCUTANEOUS | 0 refills | Status: DC

## 2016-04-24 MED ORDER — WARFARIN SODIUM 5 MG PO TABS
5.0000 mg | ORAL_TABLET | Freq: Once | ORAL | Status: DC
  Filled 2016-04-24: qty 1

## 2016-04-24 MED ORDER — POTASSIUM CHLORIDE CRYS ER 20 MEQ PO TBCR
40.0000 meq | EXTENDED_RELEASE_TABLET | Freq: Once | ORAL | Status: AC
  Administered 2016-04-24: 40 meq via ORAL
  Filled 2016-04-24: qty 2

## 2016-04-24 MED ORDER — WARFARIN SODIUM 5 MG PO TABS: 5.0000 mg | ORAL_TABLET | Freq: Every day | ORAL | 0 refills | Status: DC

## 2016-04-24 NOTE — Progress Notes (Signed)
ANTICOAGULATION CONSULT NOTE - [place] for enoxaparin> warfarin Indication: DVT  Allergies  Allergen Reactions  . Bee Venom Shortness Of Breath and Swelling  . Phenytoin Sodium Nausea And Vomiting and Rash    MUST HAVE NAME BRAND DILANTIN    Patient Measurements: Height: 5\' 9"  (175.3 cm) Weight: 132 lb (59.9 kg) IBW/kg (Calculated) : 70.7   Vital Signs: Temp: 97.8 F (36.6 C) (08/24 0535) Temp Source: Oral (08/24 0535) BP: 138/73 (08/24 0616) Pulse Rate: 57 (08/24 0616)  Labs:  Recent Labs  04/22/16 1145 04/23/16 0526 04/24/16 0609  HGB 9.7* 9.7* 10.6*  HCT 28.4* 30.2* 32.5*  PLT 333 338 381  APTT 35  --   --   LABPROT 14.2 13.7 16.7*  INR 1.10 1.05 1.35  CREATININE 0.77  --  0.77    Estimated Creatinine Clearance: 85.3 mL/min (by C-G formula based on SCr of 0.8 mg/dL).  Assessment: 60 yo m presenting to [facility] with swelling to leg and foot since Saturday.  Found to have new DVT.  Patient started on Lovenox bridge to Coumadin 8/22.  Today is day #3 of 5d minimum overlap therapy.  INR is rising nicely after 2 x 7.5 mg doses.  Currently on Lovenox 1.5mg /kg q24h for ease of administration at home in the event patient is discharged home on Lovenox.  Unfortunately the patient is not a candidate for any DOAC (apixiban, rixaroxaban, pradaxa, edoxaban) due to concurrent phenytoin (phenytoin results in concentrations of all DOACs)   Plan:  Coumadin 5mg  po today Recommend Coumadin 5mg  daily through Monday 8/28 if patient is discharged home today. Lovenox 90 mg SQ q24h - next dose due today at 1600. Continue lovenox for a minimum of 5 days overlap Daily PT/INR Outpt INR appointment scheduled for Monday 8/28. Completed education with patient and brother via phone on Coumadin.  [employer], Pharm.D., BCPS Clinical Pharmacist Pager [phone] 04/24/2016 1:33 PM

## 2016-04-24 NOTE — Progress Notes (Signed)
Nsg Discharge Note  Admit Date:  04/22/2016 Discharge date: 04/24/2016   Aneil Angelini to be D/C'd Home per MD order.  AVS completed.  Copy for chart, and copy for patient signed, and dated. Patient/caregiver able to verbalize understanding.  Discharge Medication:   Medication List    TAKE these medications   alendronate 70 MG tablet Commonly known as:  FOSAMAX Take 70 mg by mouth once a week. Take with a full glass of water on an empty stomach.   atenolol 25 MG tablet Commonly known as:  TENORMIN Take 25 mg by mouth daily.   cyanocobalamin 1000 MCG/ML injection Commonly known as:  (VITAMIN B-12) Inject 1,000 mcg into the muscle every 30 (thirty) days.   enoxaparin 30 MG/0.3ML injection Commonly known as:  LOVENOX Inject 0.9 mLs (90 mg total) into the skin daily.   levETIRAcetam 500 MG tablet Commonly known as:  KEPPRA Take 500 mg by mouth 2 (two) times daily.   multivitamin with minerals Tabs tablet Take 1 tablet by mouth daily.   phenytoin 100 MG ER capsule Commonly known as:  DILANTIN Take 200 mg by mouth 2 (two) times daily.   potassium chloride 10 MEQ CR capsule Commonly known as:  MICRO-K Take 10 mEq by mouth daily.   Vitamin D3 5000 units Caps Take 1 capsule by mouth daily.   warfarin 5 MG tablet Commonly known as:  COUMADIN Take 1 tablet (5 mg total) by mouth daily at 6 PM.       Discharge Assessment: Vitals:   04/24/16 0616 04/24/16 1428  BP: 138/73 (!) 151/80  Pulse: (!) 57 (!) 58  Resp:  16  Temp:  98.6 F (37 C)   Skin clean, dry and intact without evidence of skin break down, no evidence of skin tears noted. IV catheter discontinued intact. Site without signs and symptoms of complications - no redness or edema noted at insertion site, patient denies c/o pain - only slight tenderness at site.  Dressing with slight pressure applied.  D/c Instructions-Education: Discharge instructions given to patient/family with verbalized understanding. D/c  education completed with patient/family including follow up instructions, medication list, d/c activities limitations if indicated, with other d/c instructions as indicated by MD - patient able to verbalize understanding, all questions fully answered. Patient instructed to return to ED, call 911, or call MD for any changes in condition.  Patient escorted via [place], and D/C home via private auto.  Tisheena Maguire Margaretha Sheffield, RN 04/24/2016 5:28 PM

## 2016-04-24 NOTE — Discharge Summary (Signed)
Physician Discharge Summary  Rick Walsh [id number] DOB: October 27, 1955 DOA: 04/22/2016  PCP: Pcp Not In System  Admit date: 04/22/2016 Discharge date: 04/24/2016  Time spent: 65 minutes  Recommendations for Outpatient Follow-up:  1. Follow-up with Dr Raoul Pitch, on Monday, 04/28/2016 for PT/INR check as well as to establish as a new PCP. Patient also need a CBC as well as a BMET checked to follow-up on electrolytes and renal function. 2. Patient will likely need to be on anticoagulation anywhere from 3-6 months. Patient will be following up with her PCP. Once patient is of anticoagulation patient may be referred to hematologist for further workup or hypercoagulable workup.   Discharge Diagnoses:  Principal Problem:   Right leg DVT ([place]) Active Problems:   DVT (deep venous thrombosis), right   Benign essential HTN   Seizures ([place])   Discharge Condition: Stable and improved  Diet recommendation: Heart healthy  Filed Weights   04/22/16 0939  Weight: 59.9 kg (132 lb)    History of present illness:  Per Dr Tat Marene Lenz is a 60 y.o. male with medical history of hypertension, seizure disorder, GI cancer, cognitive impairment presented with approximately one-week history of cramping and pain about his right calf and ankle. The patient felt like he twisted his ankle on 04/11/2016 while working in yard. He did not pay too much attention to it for the next several days. However, on 04/16/2016, he began noticing cramping and pain in his right calf and right ankle. His sister sprayed some "biofreeze" onto the right. However over the next 24-48 hours there was no improvement. He noticed increasing swelling and pain in his right leg over the next 2-3 days. As a result, he presented to the emergency department for further evaluation. Notably, the patient recently flew back to [place] from [place] on 03/19/2016. He denies any recent injury or surgeries. There is no personal history of VTE, but  there is family history of blood clots and brother and sisters. He denies any fevers, chills, chest discomfort, short of breath, nausea, vomiting, diarrhea, abdominal pain, dysuria, hematuria. Denies headache or dizziness. There is no syncope. In the emergency room, the patient was afebrile and hemodynamically stable. Venous duplex of right lower extension May showed extensive DVT in the right femoral, right popliteal, and right calf veins. Because the patient has cognitive impairment and does not have a primary provider presently, the decision was made to admit the patient for coordination of care and to start anticoagulation.    Hospital Course:  #1 acute extensive occlusive right femoral, popliteal, and calf veins DVT  Patient stated he spends half the year in [place] and the other half in [place] recently returned from [place]. Patient with no prior history of DVT however per admitting physician patient with family history of DVT. Concern for unprovoked DVT in patient with some previous history of some type of GI cancer now in remission status post resection 2010. Patient did not require any chemotherapy or radiation therapy. Due to interaction between NOAC and Dilantin patient will need to be on Coumadin for anticoagulation. Patient was placed on a Lovenox bridge with warfarin. Patient had a overlap of Lovenox and warfarin for 3 days during the hospitalization needs at least 5 days overlap. Patient will likely require anywhere from 3-6 months of anticoagulation. Patient's brother underwent Lovenox teaching and patient be discharged home on a Lovenox bridge with Coumadin. Patient will follow-up on Monday, 04/28/2016 at the PCPs office for PT/INR check and further recommendations. Patient  will likely need to follow-up with a hematologist in several months for further workup for hypercoagulable state once he finishes course of anticoagulation.  #2 hypertension Stable. Continued on home regimen of  atenolol.  #3 seizure disorder Stable. No noted seizures during this hospitalization. Continued on home regimen of Keppra and Dilantin.   Procedures:  Bilateral lower extremity Dopplers 04/22/2016  Consultations:  None  Discharge Exam: Vitals:   04/24/16 0616 04/24/16 1428  BP: 138/73 (!) 151/80  Pulse: (!) 57 (!) 58  Resp:  16  Temp:  98.6 F (37 C)    General: NAD Cardiovascular: RRR Respiratory: CTAB  Discharge Instructions   Discharge Instructions    Diet - low sodium heart healthy    Complete by:  As directed   Increase activity slowly    Complete by:  As directed     Current Discharge Medication List    START taking these medications   Details  enoxaparin (LOVENOX) 30 MG/0.3ML injection Inject 0.9 mLs (90 mg total) into the skin daily. Qty: 5 Syringe, Refills: 0    warfarin (COUMADIN) 5 MG tablet Take 1 tablet (5 mg total) by mouth daily at 6 PM. Qty: 30 tablet, Refills: 0      CONTINUE these medications which have NOT CHANGED   Details  alendronate (FOSAMAX) 70 MG tablet Take 70 mg by mouth once a week. Take with a full glass of water on an empty stomach.    atenolol (TENORMIN) 25 MG tablet Take 25 mg by mouth daily.     Cholecalciferol (VITAMIN D3) 5000 units CAPS Take 1 capsule by mouth daily.    cyanocobalamin (,VITAMIN B-12,) 1000 MCG/ML injection Inject 1,000 mcg into the muscle every 30 (thirty) days.    levETIRAcetam (KEPPRA) 500 MG tablet Take 500 mg by mouth 2 (two) times daily.    Multiple Vitamin (MULTIVITAMIN WITH MINERALS) TABS tablet Take 1 tablet by mouth daily.    phenytoin (DILANTIN) 100 MG ER capsule Take 200 mg by mouth 2 (two) times daily.     potassium chloride (MICRO-K) 10 MEQ CR capsule Take 10 mEq by mouth daily.      STOP taking these medications     cyanocobalamin 1000 MCG tablet        Allergies  Allergen Reactions  . Bee Venom Shortness Of Breath and Swelling  . Phenytoin Sodium Nausea And Vomiting and  Rash    MUST HAVE NAME BRAND DILANTIN   Follow-up Information    Howard Pouch, DO. Go on 04/28/2016.   Specialty:  Family Medicine Why:  10:00. Appointment for INR check and to establish PCP. Contact information: [address] [place] [address] [phone]            The results of significant diagnostics from this hospitalization (including imaging, microbiology, ancillary and laboratory) are listed below for reference.    Significant Diagnostic Studies: US Venous Img Lower Unilateral Right  Result Date: 04/22/2016 CLINICAL DATA:  Acute right lower extremity swelling and pain for 3 days EXAM: RIGHT LOWER EXTREMITY VENOUS DOPPLER ULTRASOUND TECHNIQUE: Gray-scale sonography with graded compression, as well as color Doppler and duplex ultrasound were performed to evaluate the lower extremity deep venous systems from the level of the common femoral vein and including the common femoral, femoral, profunda femoral, popliteal and calf veins including the posterior tibial, peroneal and gastrocnemius veins when visible. The superficial great saphenous vein was also interrogated. Spectral Doppler was utilized to evaluate flow at rest and with distal  augmentation maneuvers in the common femoral, femoral and popliteal veins. COMPARISON:  None. FINDINGS: Contralateral Common Femoral Vein: Acute hypoechoic intraluminal thrombus within the common femoral vein appearing adherent to a valve, with some mobility noted during respiratory phasicity. Thrombus is nonocclusive at this level with partial compressibility noted. Common Femoral Vein: Acute hypoechoic intraluminal thrombus. Partially occlusive and partially compressible. Saphenofemoral Junction: Small amount of thrombus extends into the SFJ. Profunda Femoral Vein: No evidence of thrombus. Normal compressibility and flow on color Doppler imaging. Femoral Vein: Diffuse acute hypoechoic intraluminal thrombus. Thrombus appears occlusive. Vessel is  noncompressible. No detectable flow. Popliteal Vein: Similar diffuse hypoechoic intraluminal thrombus appearing acute and occlusive. Vessel is noncompressible. No detectable flow. Calf Veins: Thrombus does appear to propagate into the tibial and peroneal calf veins as well as the gastrocnemius vein. Superficial Great Saphenous Vein: No evidence of thrombus. Normal compressibility and flow on color Doppler imaging. Venous Reflux:  None. Other Findings:  None. IMPRESSION: Extensive acute occlusive DVT within the right femoral, popliteal and calf veins. These results will be called to the ordering clinician or representative by the Radiologist Assistant, and communication documented in the PACS or zVision Dashboard. Electronically Signed   By: Jerilynn Mages.  Shick M.D.   On: 04/22/2016 11:18    Microbiology: No results found for this or any previous visit (from the past 240 hour(s)).   Labs: Basic Metabolic Panel:  Recent Labs Lab 04/22/16 1145 04/24/16 0609  NA 141 141  K 3.8 3.5  CL 105 102  CO2 31 31  GLUCOSE 105* 97  BUN 17 14  CREATININE 0.77 0.77  CALCIUM 7.9* 8.7*   Liver Function Tests: No results for input(s): AST, ALT, ALKPHOS, BILITOT, PROT, ALBUMIN in the last 168 hours. No results for input(s): LIPASE, AMYLASE in the last 168 hours. No results for input(s): AMMONIA in the last 168 hours. CBC:  Recent Labs Lab 04/22/16 1145 04/23/16 0526 04/24/16 0609  WBC 5.2 5.6 6.1  HGB 9.7* 9.7* 10.6*  HCT 28.4* 30.2* 32.5*  MCV 97.3 97.4 97.3  PLT 333 338 381   Cardiac Enzymes: No results for input(s): CKTOTAL, CKMB, CKMBINDEX, TROPONINI in the last 168 hours. BNP: BNP (last 3 results) No results for input(s): BNP in the last 8760 hours.  ProBNP (last 3 results) No results for input(s): PROBNP in the last 8760 hours.  CBG: No results for input(s): GLUCAP in the last 168 hours.     SignedIrine Seal MD.  [facility] 04/24/2016, 4:27 PM

## 2016-04-28 ENCOUNTER — Encounter: Payer: Self-pay | Admitting: Family Medicine

## 2016-04-28 ENCOUNTER — Ambulatory Visit (INDEPENDENT_AMBULATORY_CARE_PROVIDER_SITE_OTHER): Payer: Managed Care, Other (non HMO) | Admitting: Family Medicine

## 2016-04-28 ENCOUNTER — Telehealth: Payer: Self-pay | Admitting: Family Medicine

## 2016-04-28 VITALS — BP 166/83 | HR 52 | Temp 98.0°F | Resp 20 | Ht 69.0 in | Wt 132.8 lb

## 2016-04-28 DIAGNOSIS — Z7189 Other specified counseling: Secondary | ICD-10-CM | POA: Diagnosis not present

## 2016-04-28 DIAGNOSIS — I824Z1 Acute embolism and thrombosis of unspecified deep veins of right distal lower extremity: Secondary | ICD-10-CM

## 2016-04-28 DIAGNOSIS — R569 Unspecified convulsions: Secondary | ICD-10-CM | POA: Diagnosis not present

## 2016-04-28 DIAGNOSIS — I10 Essential (primary) hypertension: Secondary | ICD-10-CM | POA: Diagnosis not present

## 2016-04-28 DIAGNOSIS — Z7689 Persons encountering health services in other specified circumstances: Secondary | ICD-10-CM

## 2016-04-28 DIAGNOSIS — I824Y1 Acute embolism and thrombosis of unspecified deep veins of right proximal lower extremity: Secondary | ICD-10-CM

## 2016-04-28 DIAGNOSIS — Z7901 Long term (current) use of anticoagulants: Secondary | ICD-10-CM

## 2016-04-28 DIAGNOSIS — Z5181 Encounter for therapeutic drug level monitoring: Secondary | ICD-10-CM

## 2016-04-28 LAB — PROTIME-INR
INR: 3.1 ratio — AB (ref 0.8–1.0)
Prothrombin Time: 34 s — ABNORMAL HIGH (ref 9.6–13.1)

## 2016-04-28 LAB — COMPREHENSIVE METABOLIC PANEL
ALBUMIN: 3.6 g/dL (ref 3.5–5.2)
ALT: 29 U/L (ref 0–53)
AST: 28 U/L (ref 0–37)
Alkaline Phosphatase: 80 U/L (ref 39–117)
BUN: 25 mg/dL — ABNORMAL HIGH (ref 6–23)
CALCIUM: 8 mg/dL — AB (ref 8.4–10.5)
CHLORIDE: 106 meq/L (ref 96–112)
CO2: 30 mEq/L (ref 19–32)
CREATININE: 0.81 mg/dL (ref 0.40–1.50)
GFR: 103.74 mL/min (ref >60.00)
Glucose, Bld: 75 mg/dL (ref 70–99)
POTASSIUM: 4 meq/L (ref 3.5–5.1)
Sodium: 142 mEq/L (ref 135–145)
Total Bilirubin: 0.2 mg/dL (ref 0.2–1.2)
Total Protein: 7.3 g/dL (ref 6.0–8.3)

## 2016-04-28 LAB — CBC WITH DIFFERENTIAL/PLATELET
BASOS ABS: 0 10*3/uL (ref 0.0–0.1)
Basophils Relative: 0.7 % (ref 0.0–3.0)
Eosinophils Absolute: 0.3 10*3/uL (ref 0.0–0.7)
Eosinophils Relative: 3.8 % (ref 0.0–5.0)
HEMATOCRIT: 32.9 % — AB (ref 39.0–52.0)
Hemoglobin: 10.8 g/dL — ABNORMAL LOW (ref 13.0–17.0)
LYMPHS PCT: 15 % (ref 12.0–46.0)
Lymphs Abs: 1.1 10*3/uL (ref 0.7–4.0)
MCHC: 32.8 g/dL (ref 30.0–36.0)
MCV: 99.2 fl (ref 78.0–100.0)
MONOS PCT: 8 % (ref 3.0–12.0)
Monocytes Absolute: 0.6 10*3/uL (ref 0.1–1.0)
NEUTROS ABS: 5.1 10*3/uL (ref 1.4–7.7)
Neutrophils Relative %: 72.5 % (ref 43.0–77.0)
Platelets: 427 10*3/uL — ABNORMAL HIGH (ref 150.0–400.0)
RBC: 3.32 Mil/uL — AB (ref 4.22–5.81)
RDW: 13.4 % (ref 11.5–15.5)
WBC: 7 10*3/uL (ref 4.0–10.5)

## 2016-04-28 NOTE — Telephone Encounter (Signed)
Left message for patient brother to call back to review instructions.

## 2016-04-28 NOTE — Telephone Encounter (Signed)
Spoke with patient brother reviewed instructions in detail. Patient brother repeated instructions correctly.

## 2016-04-28 NOTE — Telephone Encounter (Signed)
Please call pt: - have him stop the lovenox.  - I would like him to change his coumadin dose to 5 mg daily, except 2.5 (1/2 pill) on Tuesday (tomorrow).  - F/U 1 week for repeat labs- can have completed at cou[facility]in clinic at [facility] We will need to test weekly at coumadin clinic for 4 weeks. If stable on dose, will check every 2 weeks.  - I would like to see him personally in 2 weeks concerning lower end calcium concentration in his blood.

## 2016-04-28 NOTE — Patient Instructions (Addendum)
We will recheck lab work and instruct you on dosing of coumadin.  Goal: 2.0-3.0 INR. Once this achieved will DC Lovenox. Will refer to hematology for further evaluation on blood clot.  Requesting all records especially work up on colon polyp.  We may schedule future INR (blood check at [facility] office for more efficiency).  We will call  You with results and discuss when you need to be seen next,

## 2016-04-28 NOTE — Progress Notes (Signed)
Patient ID: Rick Walsh, male  DOB: 1956/02/15, 60 y.o.   MRN: [id number] Patient Care Team    Relationship Specialty Notifications Start End  Pcp Not In System PCP - General   04/22/16    Comment: Dr. Drucie Opitz is a Internist in [place], [place]    Subjective:  Rick Walsh is a 60 y.o.  male present for new patient establishment. All past medical history, surgical history, allergies, family history, immunizations, medications and social history were obtained/entered in the electronic medical record today. All recent labs, ED visits and hospitalizations within the last year were reviewed.  Patient has mild cognitive delay since birth and lives with his brother in [place]. He also lives in [place] with is sister and that is the location of all his doctors. Pt has multiple comorbid conditions to consider, of which no records are provided. Pt was experiencing right  leg pain and swelling, and went to ED 04/22/2016.   He was admitted for DVT by Internal medicine and started on Lovenox/coumadin bridge and discharged prior to therapeutic dose achieved. Patients brother has been continuing the the Lovenox injections as instructed and coumadin 5 mg daily. He is on day 6 of lovenox today.  Pt denies any increase  In bleeding/bruising. He endorses continued swelling in his right LE. He traveled by car from [place] 1 month ago, he frequently flies between [place] and [place]. He has no prior history DVT. There is a fhx of DVT.  Pt has a confusing h/o GI cancer? No history details other than he did not need chemotherapy or radiation is provided.  Seizure disorder on Keppra and Dilantin. Pt states last seizure was many years ago when they were changing medications and he was weaning off of phenobarbital.   Hypertension: Currently on tenormin 25 mg.    Health maintenance:  Colonoscopy, Immunizations, cancer/ID screenings unknown. Records requested.  There is no immunization history on file for this  patient.   Past Medical History:  Diagnosis Date  . B12 deficiency anemia    "takes the shots" (04/22/2016)  . Borderline hyperglycemia   . DVT (deep venous thrombosis) ([place]) 04/22/2016   "right ankle"  . 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 Hypertension   . Osteoporosis   . 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 Bee Venom Shortness Of Breath and Swelling  . Phenytoin Sodium Nausea And Vomiting and Rash    MUST HAVE NAME BRAND DILANTIN   Past Surgical History:  Procedure Laterality Date  . APPENDECTOMY    . COLON SURGERY  07/2009   "distal resection small bowel for cancer"  . EXCISIONAL HEMORRHOIDECTOMY    . INGUINAL HERNIA REPAIR Right   . TONSILLECTOMY     Family History  Problem Relation Age of Onset  . Cancer Mother   . Arthritis Father   . Hearing loss Father   . Heart disease Father   . Early death Father   . Diabetes Sister   . Arthritis Sister   . Heart disease Sister   . Heart disease Brother   . Diabetes Brother   . Arthritis Brother    Social History   Social History  . Marital status: Single    Spouse name: N/A  . Number of children: N/A  . Years of education: N/A   Occupational History  .  Not on file.   Social History Main Topics  . Smoking status: Never Smoker  . Smokeless tobacco: Never Used  . Alcohol use No  . Drug use: No  . Sexual activity: No   Other Topics Concern  . Not on file   Social History Narrative  . No narrative on file     Medication List       Accurate as of 04/28/16 10:31 AM. Always use your most recent med list.          alendronate 70 MG tablet Commonly known as:  FOSAMAX Take 70 mg by mouth once a week. Take with a full glass of water on an empty stomach.   atenolol 25 MG tablet Commonly known as:  TENORMIN Take 25 mg by mouth daily.   cyanocobalamin 1000 MCG/ML  injection Commonly known as:  (VITAMIN B-12) Inject 1,000 mcg into the muscle every 30 (thirty) days.   enoxaparin 30 MG/0.3ML injection Commonly known as:  LOVENOX Inject 0.9 mLs (90 mg total) into the skin daily.   levETIRAcetam 500 MG tablet Commonly known as:  KEPPRA Take 500 mg by mouth 2 (two) times daily.   multivitamin with minerals Tabs tablet Take 1 tablet by mouth daily.   phenytoin 100 MG ER capsule Commonly known as:  DILANTIN Take 200 mg by mouth 2 (two) times daily.   potassium chloride 10 MEQ CR capsule Commonly known as:  MICRO-K Take 10 mEq by mouth daily.   Vitamin D3 5000 units Caps Take 1 capsule by mouth daily.   warfarin 5 MG tablet Commonly known as:  COUMADIN Take 1 tablet (5 mg total) by mouth daily at 6 PM.        Recent Results (from the past 2160 hour(s))  CBC     Status: Abnormal   Collection Time: 04/22/16 11:45 AM  Result Value Ref Range   WBC 5.2 4.0 - 10.5 K/uL   RBC 2.92 (L) 4.22 - 5.81 MIL/uL   Hemoglobin 9.7 (L) 13.0 - 17.0 g/dL   HCT 28.4 (L) 39.0 - 52.0 %   MCV 97.3 78.0 - 100.0 fL   MCH 33.2 26.0 - 34.0 pg   MCHC 34.2 30.0 - 36.0 g/dL   RDW 11.5 11.5 - 15.5 %   Platelets 333 150 - 400 K/uL  Basic metabolic panel     Status: Abnormal   Collection Time: 04/22/16 11:45 AM  Result Value Ref Range   Sodium 141 135 - 145 mmol/L   Potassium 3.8 3.5 - 5.1 mmol/L   Chloride 105 101 - 111 mmol/L   CO2 31 22 - 32 mmol/L   Glucose, Bld 105 (H) 65 - 99 mg/dL   BUN 17 6 - 20 mg/dL   Creatinine, Ser 0.77 0.61 - 1.24 mg/dL   Calcium 7.9 (L) 8.9 - 10.3 mg/dL   GFR calc non Af Amer >60 >60 mL/min   GFR calc Af Amer >60 >60 mL/min    Comment: (NOTE) The eGFR has been calculated using the CKD EPI equation. This calculation has not been validated in all clinical situations. eGFR's persistently <60 mL/min signify possible Chronic Kidney Disease.    Anion gap 5 5 - 15  Protime-INR     Status: None   Collection Time: 04/22/16 11:45  AM  Result Value Ref Range   Prothrombin Time 14.2 11.4 - 15.2 seconds   INR 1.10   APTT     Status: None   Collection Time: 04/22/16 11:45 AM  Result Value Ref Range   aPTT 35 24 - 36 seconds  Occult blood card to lab, stool RN will collect     Status: None   Collection Time: 04/22/16  1:40 PM  Result Value Ref Range   Fecal Occult Bld NEGATIVE NEGATIVE  CBC     Status: Abnormal   Collection Time: 04/23/16  5:26 AM  Result Value Ref Range   WBC 5.6 4.0 - 10.5 K/uL   RBC 3.10 (L) 4.22 - 5.81 MIL/uL   Hemoglobin 9.7 (L) 13.0 - 17.0 g/dL   HCT 30.2 (L) 39.0 - 52.0 %   MCV 97.4 78.0 - 100.0 fL   MCH 31.3 26.0 - 34.0 pg   MCHC 32.1 30.0 - 36.0 g/dL   RDW 12.3 11.5 - 15.5 %   Platelets 338 150 - 400 K/uL  Protime-INR     Status: None   Collection Time: 04/23/16  5:26 AM  Result Value Ref Range   Prothrombin Time 13.7 11.4 - 15.2 seconds   INR 1.05   Protime-INR     Status: Abnormal   Collection Time: 04/24/16  6:09 AM  Result Value Ref Range   Prothrombin Time 16.7 (H) 11.4 - 15.2 seconds   INR 4.33   Basic metabolic panel     Status: Abnormal   Collection Time: 04/24/16  6:09 AM  Result Value Ref Range   Sodium 141 135 - 145 mmol/L   Potassium 3.5 3.5 - 5.1 mmol/L   Chloride 102 101 - 111 mmol/L   CO2 31 22 - 32 mmol/L   Glucose, Bld 97 65 - 99 mg/dL   BUN 14 6 - 20 mg/dL   Creatinine, Ser 0.77 0.61 - 1.24 mg/dL   Calcium 8.7 (L) 8.9 - 10.3 mg/dL   GFR calc non Af Amer >60 >60 mL/min   GFR calc Af Amer >60 >60 mL/min    Comment: (NOTE) The eGFR has been calculated using the CKD EPI equation. This calculation has not been validated in all clinical situations. eGFR's persistently <60 mL/min signify possible Chronic Kidney Disease.    Anion gap 8 5 - 15  CBC     Status: Abnormal   Collection Time: 04/24/16  6:09 AM  Result Value Ref Range   WBC 6.1 4.0 - 10.5 K/uL   RBC 3.34 (L) 4.22 - 5.81 MIL/uL   Hemoglobin 10.6 (L) 13.0 - 17.0 g/dL   HCT 32.5 (L) 39.0 - 52.0 %    MCV 97.3 78.0 - 100.0 fL   MCH 31.7 26.0 - 34.0 pg   MCHC 32.6 30.0 - 36.0 g/dL   RDW 12.5 11.5 - 15.5 %   Platelets 381 150 - 400 K/uL    US Venous Img Lower Unilateral Right  Result Date: 04/22/2016 CLINICAL DATA:  Acute right lower extremity swelling and pain for 3 days EXAM: RIGHT LOWER EXTREMITY VENOUS DOPPLER ULTRASOUND TECHNIQUE: Gray-scale sonography with graded compression, as well as color Doppler and duplex ultrasound were performed to evaluate the lower extremity deep venous systems from the level of the common femoral vein and including the common femoral, femoral, profunda femoral, popliteal and calf veins including the posterior tibial, peroneal and gastrocnemius veins when visible. The superficial great saphenous vein was also interrogated. Spectral Doppler was utilized to evaluate flow at rest and with distal augmentation maneuvers in the common femoral, femoral and popliteal veins. COMPARISON:  None. FINDINGS: Contralateral Common Femoral Vein: Acute hypoechoic intraluminal thrombus within the common femoral vein appearing adherent  to a valve, with some mobility noted during respiratory phasicity. Thrombus is nonocclusive at this level with partial compressibility noted. Common Femoral Vein: Acute hypoechoic intraluminal thrombus. Partially occlusive and partially compressible. Saphenofemoral Junction: Small amount of thrombus extends into the SFJ. Profunda Femoral Vein: No evidence of thrombus. Normal compressibility and flow on color Doppler imaging. Femoral Vein: Diffuse acute hypoechoic intraluminal thrombus. Thrombus appears occlusive. Vessel is noncompressible. No detectable flow. Popliteal Vein: Similar diffuse hypoechoic intraluminal thrombus appearing acute and occlusive. Vessel is noncompressible. No detectable flow. Calf Veins: Thrombus does appear to propagate into the tibial and peroneal calf veins as well as the gastrocnemius vein. Superficial Great Saphenous Vein: No  evidence of thrombus. Normal compressibility and flow on color Doppler imaging. Venous Reflux:  None. Other Findings:  None. IMPRESSION: Extensive acute occlusive DVT within the right femoral, popliteal and calf veins. These results will be called to the ordering clinician or representative by the Radiologist Assistant, and communication documented in the PACS or zVision Dashboard. Electronically Signed   By: Jerilynn Mages.  Shick M.D.   On: 04/22/2016 11:18     ROS: 14 pt review of systems performed and negative (unless mentioned in an HPI)  Objective: BP (!) 166/83 (BP Location: Left Arm, Patient Position: Sitting, Cuff Size: Normal)   Pulse (!) 52   Temp 98 F (36.7 C)   Resp 20   Ht '5\' 9"'$  (1.753 m)   Wt 132 lb 12 oz (60.2 kg)   SpO2 100%   BMI 19.60 kg/m  Gen: Afebrile. No acute distress. Nontoxic in appearance, well-developed,thin pleasant caucasian male.  HENT: AT. [place].  MMM, no oral lesions, Eyes:Pupils Equal Round Reactive to light, Extraocular movements intact,  Conjunctiva without redness, discharge or icterus. Neck/lymp/endocrine: Supple no lymphadenopathy, no thyromegaly CV: RRR, +2/4 P posterior tibialis pulses.  Chest: CTAB, no wheeze, rhonchi or crackles.. Abd: Soft.NTND. BS present. no Masses palpated.  Skin: no rashes, purpura or petechiae. Warm and well-perfused. Skin intact. EXT: right LE trace edema, swelling. Mild TTP LE right.  Neuro/Msk: Normal gait. PERLA. EOMi. Alert. Oriented x3.   Psych: Normal affect, dress and demeanor. Normal speech. Normal thought content and judgment.  Assessment/plan: Rick Walsh is a 60 y.o. male present for establishment of care with recent hospitalization. Pt has multiple comornidities that are concerning and no history provided.  Pt discharged with labs indicating anemia and hypocalcemia needing followed. New DVT with lovenox/coumadin bridge in progress. Questionable h/o of GI cancer remission??? With new DVT this is a concern for potential  cause of DVT. However there is a family history of DVT and the patient was on a long car ride from [place] a few weeks prior to onset. Pt has a h/o of seizure, uncertain last appt with neurology. - INR collected today and family will be  Instructed on dosing of coumadin. If therapeutic will DC lovenox since it is day 6.  - Referral to heme/onc with new DVT will be placed.  - Will need h/o surrounding GI cancer. Records requested.  - family and pt instructed long car rides and airplane trips can put one at risk for DVT formation, as well as his fhx and malignancy.  - pt will need close follow up and weekly INR in coumadin clinic (once stable can consider every 2 weeks).    F/U 1 week.  Greater than 45 minutes was spent with patient, greater than 50% of that time was spent face-to-face with patient counseling and coordinating care.   Electronically signed  by: Howard Pouch, DO [place]

## 2016-04-29 ENCOUNTER — Encounter: Payer: Self-pay | Admitting: Family Medicine

## 2016-04-29 NOTE — Telephone Encounter (Signed)
Patient is unable to make an appt for a coumadin check at [facility] since he is not an established patient there. Please advise

## 2016-04-30 NOTE — Telephone Encounter (Signed)
Rick Walsh,  How should we schedule patients with you for a first time visit? Pt was incorrectly directed to [facility] instead of you initially.

## 2016-04-30 NOTE — Telephone Encounter (Signed)
Called Debbie back to inform of providers response. Advised to call back if further assistance is needed from our office.    Thanks.

## 2016-05-01 ENCOUNTER — Encounter: Payer: Self-pay | Admitting: Family Medicine

## 2016-05-06 ENCOUNTER — Telehealth: Payer: Self-pay | Admitting: General Practice

## 2016-05-06 NOTE — Telephone Encounter (Signed)
LMOVM to call Villa Herb, RN about scheduling appointment in coumadin clinic.

## 2016-05-06 NOTE — Telephone Encounter (Signed)
LMOVM to call Villa Herb, RN to schedule appointment in coumadin clinic.

## 2016-05-09 ENCOUNTER — Ambulatory Visit: Payer: Medicare (Managed Care) | Admitting: Family Medicine

## 2016-05-22 ENCOUNTER — Other Ambulatory Visit: Payer: Self-pay | Admitting: *Deleted

## 2016-05-22 ENCOUNTER — Ambulatory Visit (HOSPITAL_BASED_OUTPATIENT_CLINIC_OR_DEPARTMENT_OTHER)
Admission: RE | Admit: 2016-05-22 | Discharge: 2016-05-22 | Disposition: A | Discharge status: Home / Self Care | Payer: Medicare (Managed Care) | Source: Ambulatory Visit | Attending: Family Medicine | Admitting: Family Medicine

## 2016-05-22 ENCOUNTER — Encounter: Payer: Self-pay | Admitting: Family Medicine

## 2016-05-22 ENCOUNTER — Telehealth: Payer: Self-pay | Admitting: Family Medicine

## 2016-05-22 ENCOUNTER — Ambulatory Visit (INDEPENDENT_AMBULATORY_CARE_PROVIDER_SITE_OTHER): Payer: Managed Care, Other (non HMO) | Admitting: Family Medicine

## 2016-05-22 VITALS — BP 132/73 | HR 56 | Temp 97.3°F | Resp 20 | Wt 130.0 lb

## 2016-05-22 DIAGNOSIS — M533 Sacrococcygeal disorders, not elsewhere classified: Secondary | ICD-10-CM | POA: Insufficient documentation

## 2016-05-22 DIAGNOSIS — G809 Cerebral palsy, unspecified: Secondary | ICD-10-CM | POA: Insufficient documentation

## 2016-05-22 DIAGNOSIS — M810 Age-related osteoporosis without current pathological fracture: Secondary | ICD-10-CM | POA: Insufficient documentation

## 2016-05-22 DIAGNOSIS — I10 Essential (primary) hypertension: Secondary | ICD-10-CM | POA: Insufficient documentation

## 2016-05-22 DIAGNOSIS — M25551 Pain in right hip: Secondary | ICD-10-CM | POA: Diagnosis not present

## 2016-05-22 DIAGNOSIS — Z859 Personal history of malignant neoplasm, unspecified: Secondary | ICD-10-CM | POA: Insufficient documentation

## 2016-05-22 MED ORDER — WARFARIN SODIUM 5 MG PO TABS: 5.0000 mg | ORAL_TABLET | Freq: Every day | ORAL | 0 refills | Status: DC

## 2016-05-22 NOTE — Progress Notes (Signed)
Patient ID: Rick Walsh, male  DOB: 07-May-1956, 60 y.o.   MRN: [id number] Patient Care Team    Relationship Specialty Notifications Start End  Ma Hillock, DO PCP - General Family Medicine  04/28/16     Subjective:  Rick Walsh is a 60 y.o.  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 He woke his brother last night around 3 am secondary to experiencing right hip pain. [name] states he awoke to go to the bathroom and felt pain in his right hip. He points to his anterior hip joint as location. He also complains of pain on the back of his hip (points to SI). He reports being active around the yard yesterday, lifting a dog and lifting a heavy water jug. Movement, especially walking and stepping up hurts. If he is sitting he does not have pain. He denies radiation of pain to his lower extremity. He endorses pain wrapping around his hip to the back ("C" sign). 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 He also has known osteoporosis from his anti-seizure medications and prescribed fosamax.   Prior note:  Patient has mild cognitive delay since birth and lives with his brother in [place]. He also lives in [place] with is sister and that is the location of all his doctors. Pt has multiple comorbid conditions to consider, of which no records are provided. Pt was experiencing right  leg pain and swelling, and went to ED 04/22/2016.   He was admitted for DVT by Internal medicine and started on Lovenox/coumadin bridge and discharged prior to therapeutic dose achieved. Patients brother has been continuing the the Lovenox injections as instructed and coumadin 5 mg daily. He is on day 6 of lovenox today.  Pt denies any increase  In bleeding/bruising. He endorses continued swelling in his right LE. He traveled by car from [place] 1 month ago, he frequently flies between [place] and [place].  He has no prior history DVT. There is a fhx of DVT.  Pt has a confusing h/o GI cancer? No history details other than he did not need chemotherapy or radiation is provided.  Seizure disorder on Keppra and Dilantin. Pt states last seizure was many years ago when they were changing medications and he was weaning off of phenobarbital.    Past Medical History:  Diagnosis Date  . 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 B12 deficiency anemia    "takes the shots" (04/22/2016); hemetology believes related to seizure meds.   . Borderline hyperglycemia   . Cerebral palsy ([place])   . Chronic anemia   . DVT (deep venous thrombosis) ([facility]) 04/22/2016   "right ankle"  . 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 Hypertension   . Osteoporosis   . Vitamin D deficiency    Allergies  Allergen Reactions  . Bee Venom Shortness Of Breath and Swelling  . Phenytoin Sodium Nausea And Vomiting and Rash    MUST HAVE NAME BRAND DILANTIN   Past Surgical History:  Procedure Laterality Date  . APPENDECTOMY  1988  . COLONOSCOPY  2012, 2014, 2016, 2017  . EXCISIONAL HEMORRHOIDECTOMY    . INGUINAL HERNIA REPAIR Right 2003  . RECTAL SURGERY  07/2009   adenocarcinoma of  anus  . TONSILLECTOMY  1964   Family History  Problem Relation Age of Onset  . Ovarian cancer Mother   . Arthritis Father   . Hearing loss Father   . Heart disease Father   . Early death Father   . Diabetes Sister   . Arthritis Sister   . Heart disease Sister   . Heart disease Brother   . Diabetes Brother   . Arthritis Brother    Social History   Social History  . Marital status: Single    Spouse name: N/A  . Number of children: N/A  . Years of education: N/A   Occupational History  . Not on file.   Social History Main Topics  . Smoking status: Never Smoker  . Smokeless tobacco: Never Used  . Alcohol use No  . Drug use: No  . Sexual  activity: No   Other Topics Concern  . Not on file   Social History Narrative   [name] has mild cognitive decline since birth. He is single. He lives part time in [place] with his brother and part-time in [place] with his sister.    HS grad. Retired.   Daily vitamin use.   Wears seatbelt, bicycle helmet and smoke detector in the home.   He feels safe in relationships.          Medication List       Accurate as of 05/22/16 10:53 AM. Always use your most recent med list.          alendronate 70 MG tablet Commonly known as:  FOSAMAX Take 70 mg by mouth once a week. Take with a full glass of water on an empty stomach.   atenolol 25 MG tablet Commonly known as:  TENORMIN Take 25 mg by mouth daily.   cyanocobalamin 1000 MCG/ML injection Commonly known as:  (VITAMIN B-12) Inject 1,000 mcg into the muscle every 30 (thirty) days.   levETIRAcetam 500 MG tablet Commonly known as:  KEPPRA Take 500 mg by mouth 2 (two) times daily.   multivitamin with minerals Tabs tablet Take 1 tablet by mouth daily.   phenytoin 100 MG ER capsule Commonly known as:  DILANTIN Take 200 mg by mouth 2 (two) times daily.   potassium chloride 10 MEQ CR capsule Commonly known as:  MICRO-K Take 10 mEq by mouth daily.   Vitamin D3 5000 units Caps Take 1 capsule by mouth daily.   warfarin 5 MG tablet Commonly known as:  COUMADIN Take 1 tablet (5 mg total) by mouth daily at 6 PM.        Recent Results (from the past 2160 hour(s))  CBC     Status: Abnormal   Collection Time: 04/22/16 11:45 AM  Result Value Ref Range   WBC 5.2 4.0 - 10.5 K/uL   RBC 2.92 (L) 4.22 - 5.81 MIL/uL   Hemoglobin 9.7 (L) 13.0 - 17.0 g/dL   HCT 28.4 (L) 39.0 - 52.0 %   MCV 97.3 78.0 - 100.0 fL   MCH 33.2 26.0 - 34.0 pg   MCHC 34.2 30.0 - 36.0 g/dL   RDW 11.5 11.5 - 15.5 %   Platelets 333 150 - 400 K/uL  Basic metabolic panel     Status: Abnormal   Collection Time: 04/22/16 11:45 AM  Result Value Ref Range   Sodium  141 135 - 145 mmol/L   Potassium 3.8 3.5 - 5.1 mmol/L   Chloride 105 101 - 111 mmol/L   CO2 31 22 -  32 mmol/L   Glucose, Bld 105 (H) 65 - 99 mg/dL   BUN 17 6 - 20 mg/dL   Creatinine, Ser 0.77 0.61 - 1.24 mg/dL   Calcium 7.9 (L) 8.9 - 10.3 mg/dL   GFR calc non Af Amer >60 >60 mL/min   GFR calc Af Amer >60 >60 mL/min    Comment: (NOTE) The eGFR has been calculated using the CKD EPI equation. This calculation has not been validated in all clinical situations. eGFR's persistently <60 mL/min signify possible Chronic Kidney Disease.    Anion gap 5 5 - 15  Protime-INR     Status: None   Collection Time: 04/22/16 11:45 AM  Result Value Ref Range   Prothrombin Time 14.2 11.4 - 15.2 seconds   INR 1.10   APTT     Status: None   Collection Time: 04/22/16 11:45 AM  Result Value Ref Range   aPTT 35 24 - 36 seconds  Occult blood card to lab, stool RN will collect     Status: None   Collection Time: 04/22/16  1:40 PM  Result Value Ref Range   Fecal Occult Bld NEGATIVE NEGATIVE  CBC     Status: Abnormal   Collection Time: 04/23/16  5:26 AM  Result Value Ref Range   WBC 5.6 4.0 - 10.5 K/uL   RBC 3.10 (L) 4.22 - 5.81 MIL/uL   Hemoglobin 9.7 (L) 13.0 - 17.0 g/dL   HCT 30.2 (L) 39.0 - 52.0 %   MCV 97.4 78.0 - 100.0 fL   MCH 31.3 26.0 - 34.0 pg   MCHC 32.1 30.0 - 36.0 g/dL   RDW 12.3 11.5 - 15.5 %   Platelets 338 150 - 400 K/uL  Protime-INR     Status: None   Collection Time: 04/23/16  5:26 AM  Result Value Ref Range   Prothrombin Time 13.7 11.4 - 15.2 seconds   INR 1.05   Protime-INR     Status: Abnormal   Collection Time: 04/24/16  6:09 AM  Result Value Ref Range   Prothrombin Time 16.7 (H) 11.4 - 15.2 seconds   INR 8.67   Basic metabolic panel     Status: Abnormal   Collection Time: 04/24/16  6:09 AM  Result Value Ref Range   Sodium 141 135 - 145 mmol/L   Potassium 3.5 3.5 - 5.1 mmol/L   Chloride 102 101 - 111 mmol/L   CO2 31 22 - 32 mmol/L   Glucose, Bld 97 65 - 99 mg/dL    BUN 14 6 - 20 mg/dL   Creatinine, Ser 0.77 0.61 - 1.24 mg/dL   Calcium 8.7 (L) 8.9 - 10.3 mg/dL   GFR calc non Af Amer >60 >60 mL/min   GFR calc Af Amer >60 >60 mL/min    Comment: (NOTE) The eGFR has been calculated using the CKD EPI equation. This calculation has not been validated in all clinical situations. eGFR's persistently <60 mL/min signify possible Chronic Kidney Disease.    Anion gap 8 5 - 15  CBC     Status: Abnormal   Collection Time: 04/24/16  6:09 AM  Result Value Ref Range   WBC 6.1 4.0 - 10.5 K/uL   RBC 3.34 (L) 4.22 - 5.81 MIL/uL   Hemoglobin 10.6 (L) 13.0 - 17.0 g/dL   HCT 32.5 (L) 39.0 - 52.0 %   MCV 97.3 78.0 - 100.0 fL   MCH 31.7 26.0 - 34.0 pg   MCHC 32.6 30.0 - 36.0 g/dL  RDW 12.5 11.5 - 15.5 %   Platelets 381 150 - 400 K/uL  INR/PT     Status: Abnormal   Collection Time: 04/28/16 11:16 AM  Result Value Ref Range   INR 3.1 (H) 0.8 - 1.0 ratio   Prothrombin Time 34.0 (H) 9.6 - 13.1 sec  CBC w/Diff     Status: Abnormal   Collection Time: 04/28/16 11:16 AM  Result Value Ref Range   WBC 7.0 4.0 - 10.5 K/uL   RBC 3.32 (L) 4.22 - 5.81 Mil/uL   Hemoglobin 10.8 (L) 13.0 - 17.0 g/dL   HCT 32.9 (L) 39.0 - 52.0 %   MCV 99.2 78.0 - 100.0 fl   MCHC 32.8 30.0 - 36.0 g/dL   RDW 13.4 11.5 - 15.5 %   Platelets 427.0 (H) 150.0 - 400.0 K/uL   Neutrophils Relative % 72.5 43.0 - 77.0 %   Lymphocytes Relative 15.0 12.0 - 46.0 %   Monocytes Relative 8.0 3.0 - 12.0 %   Eosinophils Relative 3.8 0.0 - 5.0 %   Basophils Relative 0.7 0.0 - 3.0 %   Neutro Abs 5.1 1.4 - 7.7 K/uL   Lymphs Abs 1.1 0.7 - 4.0 K/uL   Monocytes Absolute 0.6 0.1 - 1.0 K/uL   Eosinophils Absolute 0.3 0.0 - 0.7 K/uL   Basophils Absolute 0.0 0.0 - 0.1 K/uL  Comp Met (CMET)     Status: Abnormal   Collection Time: 04/28/16 11:16 AM  Result Value Ref Range   Sodium 142 135 - 145 mEq/L   Potassium 4.0 3.5 - 5.1 mEq/L   Chloride 106 96 - 112 mEq/L   CO2 30 19 - 32 mEq/L   Glucose, Bld 75 70 - 99  mg/dL   BUN 25 (H) 6 - 23 mg/dL   Creatinine, Ser 0.81 0.40 - 1.50 mg/dL   Total Bilirubin 0.2 0.2 - 1.2 mg/dL   Alkaline Phosphatase 80 39 - 117 U/L   AST 28 0 - 37 U/L   ALT 29 0 - 53 U/L   Total Protein 7.3 6.0 - 8.3 g/dL   Albumin 3.6 3.5 - 5.2 g/dL   Calcium 8.0 (L) 8.4 - 10.5 mg/dL   GFR 103.74 >60.00 mL/min    US Venous Img Lower Unilateral Right  Result Date: 04/22/2016 CLINICAL DATA:  Acute right lower extremity swelling and pain for 3 days EXAM: RIGHT LOWER EXTREMITY VENOUS DOPPLER ULTRASOUND TECHNIQUE: Gray-scale sonography with graded compression, as well as color Doppler and duplex ultrasound were performed to evaluate the lower extremity deep venous systems from the level of the common femoral vein and including the common femoral, femoral, profunda femoral, popliteal and calf veins including the posterior tibial, peroneal and gastrocnemius veins when visible. The superficial great saphenous vein was also interrogated. Spectral Doppler was utilized to evaluate flow at rest and with distal augmentation maneuvers in the common femoral, femoral and popliteal veins. COMPARISON:  None. FINDINGS: Contralateral Common Femoral Vein: Acute hypoechoic intraluminal thrombus within the common femoral vein appearing adherent to a valve, with some mobility noted during respiratory phasicity. Thrombus is nonocclusive at this level with partial compressibility noted. Common Femoral Vein: Acute hypoechoic intraluminal thrombus. Partially occlusive and partially compressible. Saphenofemoral Junction: Small amount of thrombus extends into the SFJ. Profunda Femoral Vein: No evidence of thrombus. Normal compressibility and flow on color Doppler imaging. Femoral Vein: Diffuse acute hypoechoic intraluminal thrombus. Thrombus appears occlusive. Vessel is noncompressible. No detectable flow. Popliteal Vein: Similar diffuse hypoechoic intraluminal thrombus appearing acute and  occlusive. Vessel is noncompressible.  No detectable flow. Calf Veins: Thrombus does appear to propagate into the tibial and peroneal calf veins as well as the gastrocnemius vein. Superficial Great Saphenous Vein: No evidence of thrombus. Normal compressibility and flow on color Doppler imaging. Venous Reflux:  None. Other Findings:  None. IMPRESSION: Extensive acute occlusive DVT within the right femoral, popliteal and calf veins. These results will be called to the ordering clinician or representative by the Radiologist Assistant, and communication documented in the PACS or zVision Dashboard. Electronically Signed   By: Jerilynn Mages.  Shick M.D.   On: 04/22/2016 11:18     ROS: 14 pt review of systems performed and negative (unless mentioned in an HPI)  Objective: BP 132/73 (BP Location: Left Arm, Patient Position: Sitting, Cuff Size: Normal)   Pulse (!) 56   Temp 97.3 F (36.3 C)   Resp 20   Wt 130 lb (59 kg)   SpO2 98%   BMI 19.20 kg/m  Gen: Afebrile. No acute distress. Nontoxic in appearance, well-developed,thin pleasant caucasian male. Mild cognitive delay.  HENT: AT. [place].  MMM, no oral lesions, Eyes:Pupils Equal Round Reactive to light, Extraocular movements intact,  Conjunctiva without redness, discharge or icterus. Abd: Soft.NTND. BS present. no Masses palpated.  Neuro/Msk: very mildly guarded gait. PERLA. EOMi. Alert. Oriented x3.   Right hip: no erythema, bruising or swelling over area of pain. TTP ant hip joint and right SI. Pain with flexion and intermal rotation of hip and leg. Decreased flexion in comparison to left.   + FABRE for HIP. - SLR. NV intact distally.  No muscle pain. SI TTP, ant hip TTP. Pain with left thigh.  Psych: Normal affect, dress and demeanor. Normal speech. Normal thought content and judgment.  Assessment/plan: Jadiel Schmieder is a 60 y.o. male present for  Right hip pain/SI pain - Positive exam for hip joint pain, pt with osteoporosis on fosamax.  - DVT present and on therapy for right  Extensive acute  occlusive DVT within the right femoral, popliteal and calf veins. - NO NSAIDS with coumadin use. May use tylenol if needed only.  - may use heat therapy for comfort.  - Continue daily activity but avoid bending/lifting stratching for 1 week.  - DG HIP UNILAT WITH PELVIS 2-3 VIEWS RIGHT; Future - F/U 1 week.   > 25 minutes spent with patient, >50% of time spent face to face counseling patient and coordinating care.    Electronically signed by: Howard Pouch, DO [place]

## 2016-05-22 NOTE — Patient Instructions (Signed)
Please go get xray completed. I will call you with results.  Do not squat or pick up heavy objects for at least 1 week. Still walk around and do normal activities.  You can use tylenol for pain if needed.

## 2016-05-22 NOTE — Telephone Encounter (Signed)
Please call pt: - his xray of his hip is normal.  - follow recs given to him during appt.

## 2016-05-23 NOTE — Telephone Encounter (Signed)
Spoke with patient brother Herbie Baltimore reviewed xray results . Patient will follow instructions given at office visit yesterday.

## 2016-05-27 ENCOUNTER — Telehealth: Payer: Self-pay | Admitting: Family Medicine

## 2016-05-27 MED ORDER — WARFARIN SODIUM 5 MG PO TABS: 5.0000 mg | ORAL_TABLET | Freq: Every day | ORAL | 0 refills | Status: DC

## 2016-05-27 NOTE — Telephone Encounter (Signed)
Patient's brother Herbie Baltimore, calling to state rx for warfarin (COUMADIN) 5 MG tablet should have been sent to the [facility] in [place], [place], [place] [address]).    Rx was sent to [facility] instead, please send to correct pharmacy asap as patient is out of medication.

## 2016-05-27 NOTE — Telephone Encounter (Signed)
Resent Rx to indicated pharmacy.

## 2016-05-28 ENCOUNTER — Ambulatory Visit (HOSPITAL_BASED_OUTPATIENT_CLINIC_OR_DEPARTMENT_OTHER): Payer: Managed Care, Other (non HMO) | Admitting: Hematology & Oncology

## 2016-05-28 ENCOUNTER — Ambulatory Visit: Payer: Managed Care, Other (non HMO)

## 2016-05-28 ENCOUNTER — Encounter: Payer: Self-pay | Admitting: Hematology & Oncology

## 2016-05-28 ENCOUNTER — Other Ambulatory Visit: Payer: Self-pay | Admitting: Family

## 2016-05-28 ENCOUNTER — Other Ambulatory Visit (HOSPITAL_BASED_OUTPATIENT_CLINIC_OR_DEPARTMENT_OTHER): Payer: Managed Care, Other (non HMO)

## 2016-05-28 VITALS — BP 139/67 | HR 59 | Temp 97.5°F | Resp 16 | Ht 69.0 in | Wt 130.8 lb

## 2016-05-28 DIAGNOSIS — Z7901 Long term (current) use of anticoagulants: Secondary | ICD-10-CM

## 2016-05-28 DIAGNOSIS — I82411 Acute embolism and thrombosis of right femoral vein: Secondary | ICD-10-CM

## 2016-05-28 LAB — CBC WITH DIFFERENTIAL (CANCER CENTER ONLY)
BASO#: 0 10*3/uL (ref 0.0–0.2)
BASO%: 0.6 % (ref 0.0–2.0)
EOS ABS: 0.2 10*3/uL (ref 0.0–0.5)
EOS%: 3.3 % (ref 0.0–7.0)
HEMATOCRIT: 33.9 % — AB (ref 38.7–49.9)
HEMOGLOBIN: 11.5 g/dL — AB (ref 13.0–17.1)
LYMPH#: 1.4 10*3/uL (ref 0.9–3.3)
LYMPH%: 21.6 % (ref 14.0–48.0)
MCH: 33.4 pg (ref 28.0–33.4)
MCHC: 33.9 g/dL (ref 32.0–35.9)
MCV: 99 fL — ABNORMAL HIGH (ref 82–98)
MONO#: 0.8 10*3/uL (ref 0.1–0.9)
MONO%: 11.7 % (ref 0.0–13.0)
NEUT%: 62.8 % (ref 40.0–80.0)
NEUTROS ABS: 4.2 10*3/uL (ref 1.5–6.5)
Platelets: 314 10*3/uL (ref 145–400)
RBC: 3.44 10*6/uL — AB (ref 4.20–5.70)
RDW: 12.3 % (ref 11.1–15.7)
WBC: 6.7 10*3/uL (ref 4.0–10.0)

## 2016-05-28 LAB — COMPREHENSIVE METABOLIC PANEL (CC13)
ALBUMIN: 3.7 g/dL (ref 3.5–5.5)
ALT: 20 IU/L (ref 0–44)
AST (SGOT): 22 IU/L (ref 0–40)
Albumin/Globulin Ratio: 1 — ABNORMAL LOW (ref 1.2–2.2)
Alkaline Phosphatase, S: 99 IU/L (ref 39–117)
BUN / CREAT RATIO: 28 — AB (ref 9–20)
BUN: 23 mg/dL (ref 6–24)
Bilirubin Total: 0.2 mg/dL (ref 0.0–1.2)
CO2: 30 mmol/L — AB (ref 18–29)
CREATININE: 0.82 mg/dL (ref 0.76–1.27)
Calcium, Ser: 8 mg/dL — ABNORMAL LOW (ref 8.7–10.2)
Chloride, Ser: 102 mmol/L (ref 96–106)
GFR, EST AFRICAN AMERICAN: 113 mL/min/{1.73_m2} (ref >59)
GFR, EST NON AFRICAN AMERICAN: 97 mL/min/{1.73_m2} (ref >59)
GLOBULIN, TOTAL: 3.8 g/dL (ref 1.5–4.5)
GLUCOSE: 94 mg/dL (ref 65–99)
Potassium, Ser: 3.3 mmol/L — ABNORMAL LOW (ref 3.5–5.2)
SODIUM: 138 mmol/L (ref 134–144)
TOTAL PROTEIN: 7.5 g/dL (ref 6.0–8.5)

## 2016-05-28 LAB — PROTIME-INR (CHCC SATELLITE)
INR: 4.2 — ABNORMAL HIGH (ref 2.0–3.5)
Protime: 50.4 Seconds — ABNORMAL HIGH (ref 10.6–13.4)

## 2016-05-28 NOTE — Progress Notes (Signed)
Referral MD  Reason for Referral: Thrombus of the right femoral, popliteal and calf veins   Chief Complaint  Patient presents with  . Other    New Patient  : I have a blood clot in my right leg.  HPI: Rick Walsh is a very nice 60 year old white male. He has underlying developmental issues. He actually Spence's half the year out [place]. He recently got back from [place]. This was I think back in August or July. If he lives with his brother who really helps take care of him.  He noted that his right leg was somewhat swollen. There is pain in his thigh.  He had driven out to [place]. He flew back from [place].  He had a Doppler done. This is on August 22. This showed an extensive thrombus in the right femoral, popliteal and calf veins. He is started on Lovenox. Because he is on Dilantin, he cannot go on any of the new oral anticoagulants. As such, he was placed onto Coumadin.  His last INR was checked back in late August. It was 3.1.  There is a couple family members with blood clots.  He's had multiple past surgeries without any problems.  He does not smoke or drink.  He's had no obvious bleeding.  He was kindly referred to the [facility] to help with management of the Coumadin.  He is very nice. His family is originally from [place].  His right leg is feeling better.  Again, his Coumadin has not been checked for a month. I'm not sure as to why it has not been checked.  Rechecked it today. It is with an INR 4.2.   his appetite is good. He is not a vegetarian.  Overall, his performance status is ECOG 1.    Past Medical History:  Diagnosis Date  . 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 B12 deficiency anemia    "takes the shots" (04/22/2016); hemetology believes related to seizure meds.   . Borderline hyperglycemia   . Cerebral palsy ([place])   . Chronic anemia   . DVT (deep venous thrombosis) ([place]) 04/22/2016   "right ankle"  . Epilepsy, grand mal  ([place])    "hasn't had grand mal since he was a child; mild seizures now; changed RX to [place] in March" (04/22/2016)  . Hypertension   . Osteoporosis   . Vitamin D deficiency   :  Past Surgical History:  Procedure Laterality Date  . APPENDECTOMY  1988  . COLONOSCOPY  2012, 2014, 2016, 2017  . EXCISIONAL HEMORRHOIDECTOMY    . INGUINAL HERNIA REPAIR Right 2003  . RECTAL SURGERY  07/2009   adenocarcinoma of anus  . TONSILLECTOMY  1964  :   Current Outpatient Prescriptions:  .  alendronate (FOSAMAX) 70 MG tablet, Take 70 mg by mouth once a week. Take with a full glass of water on an empty stomach., Disp: , Rfl:  .  atenolol (TENORMIN) 25 MG tablet, Take 25 mg by mouth daily. , Disp: , Rfl:  .  Cholecalciferol (VITAMIN D3) 5000 units CAPS, Take 1 capsule by mouth daily., Disp: , Rfl:  .  cyanocobalamin (,VITAMIN B-12,) 1000 MCG/ML injection, Inject 1,000 mcg into the muscle every 30 (thirty) days., Disp: , Rfl:  .  levETIRAcetam (KEPPRA) 500 MG tablet, Take 500 mg by mouth 2 (two) times daily., Disp: , Rfl:  .  Multiple Vitamin (MULTIVITAMIN WITH MINERALS) TABS tablet, Take 1 tablet by mouth daily., Disp: , Rfl:  .  phenytoin (DILANTIN) 100 MG ER capsule, Take 200 mg by mouth 2 (two) times daily. , Disp: , Rfl:  .  potassium chloride (MICRO-K) 10 MEQ CR capsule, Take 10 mEq by mouth daily., Disp: , Rfl:  .  warfarin (COUMADIN) 5 MG tablet, Take 1 tablet (5 mg total) by mouth daily at 6 PM., Disp: 30 tablet, Rfl: 0:  :  Allergies  Allergen Reactions  . Bee Venom Shortness Of Breath and Swelling  . Phenytoin Sodium Nausea And Vomiting and Rash    MUST HAVE NAME BRAND DILANTIN  :  Family History  Problem Relation Age of Onset  . Ovarian cancer Mother   . Arthritis Father   . Hearing loss Father   . Heart disease Father   . Early death Father   . Diabetes Sister   . Arthritis Sister   . Heart disease Sister   . Heart disease Brother   . Diabetes Brother   . Arthritis Brother    :  Social History   Social History  . Marital status: Single    Spouse name: N/A  . Number of children: N/A  . Years of education: N/A   Occupational History  . Not on file.   Social History Main Topics  . Smoking status: Never Smoker  . Smokeless tobacco: Never Used  . Alcohol use No  . Drug use: No  . Sexual activity: No   Other Topics Concern  . Not on file   Social History Narrative   Rick Walsh has mild cognitive decline since birth. He is single. He lives part time in [place] with his brother and part-time in [place] with his sister.    HS grad. Retired.   Daily vitamin use.   Wears seatbelt, bicycle helmet and smoke detector in the home.   He feels safe in relationships.       :  Pertinent items are noted in HPI.  Exam: @IPVITALS @  thin but well-nourished white male in no obvious distress. Vital signs show temperature of 97.5. Pulse 59. Blood pressure 139/67. Weight is 131 pounds. Head and neck exam shows no ocular or oral lesions. He has no palpable cervical or supraclavicular lymph nodes. Lungs are clear. Cardiac exam regular rate and rhythm with no murmurs, rubs or bruits. Abdomen is soft. Has good bowel sounds. There is no fluid wave. There is no palpable liver or spleen tip. Back exam shows no tenderness over the spine, ribs or hips. Extremities shows mild nonpitting edema of the right leg. His right leg is slightly plethoric. He has decent pulses in the distal extremities. He has a negative Homans sign. Skin exam shows no rashes, ecchymoses or petechia. Neurological exam shows no focal neurological deficits.    Recent Labs  05/28/16 1412  WBC 6.7  HGB 11.5*  HCT 33.9*  PLT 314   No results for input(s): NA, K, CL, CO2, GLUCOSE, BUN, CREATININE, CALCIUM in the last 72 hours.  Blood smear review: None   Pathology: None     Assessment and Plan:  Rick Walsh is a 60 year old white male. He has a chronic developmental disorder. He is on multiple medications,  including Dilantin. As such, he can I go on any of the new oral anticoagulants.  He has a thrombus in the right leg. I would have to think that this is idiopathic although it might be hereditary given that there is a family history of blood clots. He did travel back from [place] which might be construed  as a risk factor.  We will have to monitor his Coumadin. I probably would keep him on Coumadin for 6 months. He has a fairly extensive thrombus. Probably in 3 months, I would repeat his Doppler to see how the thrombus looks.  For now, we will have to watch his INR weekly. It will be a real aggravation to monitor and adjust his Coumadin but we will do this.  He is very nice. His brother is very nice.  For now, I told him to adjust his Coumadin dose to 5 mg a day except for Tuesday and Thursday which she will take 2.5 mg daily.  I do not see a need to do any type of tumor workup. This, I think, would be low yield.  I spent about 45 minutes with he and his brother.  I will plan to him come back weekly for his INR. I will see him back in about 5 weeks.

## 2016-05-29 ENCOUNTER — Ambulatory Visit: Payer: Medicare (Managed Care) | Admitting: Hematology

## 2016-05-29 ENCOUNTER — Ambulatory Visit (INDEPENDENT_AMBULATORY_CARE_PROVIDER_SITE_OTHER): Payer: Managed Care, Other (non HMO) | Admitting: Family Medicine

## 2016-05-29 ENCOUNTER — Encounter: Payer: Self-pay | Admitting: Family Medicine

## 2016-05-29 VITALS — BP 145/84 | HR 60 | Temp 97.7°F | Resp 20 | Ht 69.0 in | Wt 129.5 lb

## 2016-05-29 DIAGNOSIS — R569 Unspecified convulsions: Secondary | ICD-10-CM | POA: Diagnosis not present

## 2016-05-29 DIAGNOSIS — M533 Sacrococcygeal disorders, not elsewhere classified: Secondary | ICD-10-CM | POA: Diagnosis not present

## 2016-05-29 DIAGNOSIS — M25551 Pain in right hip: Secondary | ICD-10-CM

## 2016-05-29 LAB — PROTEIN C, TOTAL: PROTEIN C ANTIGEN: 64 % (ref 60–150)

## 2016-05-29 NOTE — Patient Instructions (Signed)
It was nice to see you again. I am glad to see you are doing better.  Make sure you are lifting properly when doing work.  I will see you beginning January

## 2016-05-29 NOTE — Progress Notes (Signed)
Patient ID: Dondi Aime, male  DOB: 11/21/1955, 60 y.o.   MRN: [id number] Patient Care Team    Relationship Specialty Notifications Start End  Ma Hillock, DO PCP - General Family Medicine  04/28/16     Subjective:  Calub Tarnow is a 60 y.o.  male present for follow up on right hip pain  Right hip pain:  Patient states his hip is no longer hurting him. He is back to his normal activities with out pain. He only took tylenol if needed.   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 He woke his brother last night around 3 am secondary to experiencing right hip pain. Leith states he awoke to go to the bathroom and felt pain in his right hip. He points to his anterior hip joint as location. He also complains of pain on the back of his hip (points to SI). He reports being active around the yard yesterday, lifting a dog and lifting a heavy water jug. Movement, especially walking and stepping up hurts. If he is sitting he does not have pain. He denies radiation of pain to his lower extremity. He endorses pain wrapping around his hip to the back ("C" sign). 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 He also has known osteoporosis from his anti-seizure medications and prescribed fosamax.   Prior note:  Patient has mild cognitive delay since birth and lives with his brother in [place]. He also lives in [place] with is sister and that is the location of all his doctors. Pt has multiple comorbid conditions to consider, of which no records are provided. Pt was experiencing right  leg pain and swelling, and went to ED 04/22/2016.   He was admitted for DVT by Internal medicine and started on Lovenox/coumadin bridge and discharged prior to therapeutic dose achieved. Patients brother has been continuing the the Lovenox injections as instructed and coumadin 5 mg daily. He is on day 6 of lovenox today.  Pt denies any increase  In  bleeding/bruising. He endorses continued swelling in his right LE. He traveled by car from [place] 1 month ago, he frequently flies between [place] and [place]. He has no prior history DVT. There is a fhx of DVT.  Pt has a confusing h/o GI cancer? No history details other than he did not need chemotherapy or radiation is provided.  Seizure disorder on Keppra and Dilantin. Pt states last seizure was many years ago when they were changing medications and he was weaning off of phenobarbital.    Past Medical History:  Diagnosis Date  . 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 B12 deficiency anemia    "takes the shots" (04/22/2016); hemetology believes related to seizure meds.   . Borderline hyperglycemia   . Cerebral palsy ([place])   . Chronic anemia   . DVT (deep venous thrombosis) ([place]) 04/22/2016   "right ankle"  . 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 Hypertension   . Osteoporosis   . Vitamin D deficiency    Allergies  Allergen Reactions  . Bee Venom Shortness Of Breath and Swelling  . Phenytoin Sodium Nausea And Vomiting and Rash    MUST HAVE NAME BRAND DILANTIN   Past Surgical History:  Procedure Laterality Date  . APPENDECTOMY  1988  .  COLONOSCOPY  2012, 2014, 2016, 2017  . EXCISIONAL HEMORRHOIDECTOMY    . INGUINAL HERNIA REPAIR Right 2003  . RECTAL SURGERY  07/2009   adenocarcinoma of anus  . TONSILLECTOMY  1964   Family History  Problem Relation Age of Onset  . Ovarian cancer Mother   . Arthritis Father   . Hearing loss Father   . Heart disease Father   . Early death Father   . Diabetes Sister   . Arthritis Sister   . Heart disease Sister   . Heart disease Brother   . Diabetes Brother   . Arthritis Brother    Social History   Social History  . Marital status: Single    Spouse name: N/A  . Number of children: N/A  . Years of education: N/A   Occupational History  .  Not on file.   Social History Main Topics  . Smoking status: Never Smoker  . Smokeless tobacco: Never Used  . Alcohol use No  . Drug use: No  . Sexual activity: No   Other Topics Concern  . Not on file   Social History Narrative   Travin has mild cognitive decline since birth. He is single. He lives part time in [place] with his brother and part-time in [place] with his sister.    HS grad. Retired.   Daily vitamin use.   Wears seatbelt, bicycle helmet and smoke detector in the home.   He feels safe in relationships.          Medication List       Accurate as of 05/29/16 11:36 AM. Always use your most recent med list.          alendronate 70 MG tablet Commonly known as:  FOSAMAX Take 70 mg by mouth once a week. Take with a full glass of water on an empty stomach.   atenolol 25 MG tablet Commonly known as:  TENORMIN Take 25 mg by mouth daily.   cyanocobalamin 1000 MCG/ML injection Commonly known as:  (VITAMIN B-12) Inject 1,000 mcg into the muscle every 30 (thirty) days.   levETIRAcetam 500 MG tablet Commonly known as:  KEPPRA Take 500 mg by mouth 2 (two) times daily.   multivitamin with minerals Tabs tablet Take 1 tablet by mouth daily.   phenytoin 100 MG ER capsule Commonly known as:  DILANTIN Take 200 mg by mouth 2 (two) times daily.   potassium chloride 10 MEQ CR capsule Commonly known as:  MICRO-K Take 10 mEq by mouth daily.   Vitamin D3 5000 units Caps Take 1 capsule by mouth daily.   warfarin 5 MG tablet Commonly known as:  COUMADIN Take 1 tablet (5 mg total) by mouth daily at 6 PM.        Recent Results (from the past 2160 hour(s))  CBC     Status: Abnormal   Collection Time: 04/22/16 11:45 AM  Result Value Ref Range   WBC 5.2 4.0 - 10.5 K/uL   RBC 2.92 (L) 4.22 - 5.81 MIL/uL   Hemoglobin 9.7 (L) 13.0 - 17.0 g/dL   HCT 28.4 (L) 39.0 - 52.0 %   MCV 97.3 78.0 - 100.0 fL   MCH 33.2 26.0 - 34.0 pg   MCHC 34.2 30.0 - 36.0 g/dL   RDW 11.5 11.5  - 15.5 %   Platelets 333 150 - 400 K/uL  Basic metabolic panel     Status: Abnormal   Collection Time: 04/22/16 11:45 AM  Result Value Ref Range  Sodium 141 135 - 145 mmol/L   Potassium 3.8 3.5 - 5.1 mmol/L   Chloride 105 101 - 111 mmol/L   CO2 31 22 - 32 mmol/L   Glucose, Bld 105 (H) 65 - 99 mg/dL   BUN 17 6 - 20 mg/dL   Creatinine, Ser 0.77 0.61 - 1.24 mg/dL   Calcium 7.9 (L) 8.9 - 10.3 mg/dL   GFR calc non Af Amer >60 >60 mL/min   GFR calc Af Amer >60 >60 mL/min    Comment: (NOTE) The eGFR has been calculated using the CKD EPI equation. This calculation has not been validated in all clinical situations. eGFR's persistently <60 mL/min signify possible Chronic Kidney Disease.    Anion gap 5 5 - 15  Protime-INR     Status: None   Collection Time: 04/22/16 11:45 AM  Result Value Ref Range   Prothrombin Time 14.2 11.4 - 15.2 seconds   INR 1.10   APTT     Status: None   Collection Time: 04/22/16 11:45 AM  Result Value Ref Range   aPTT 35 24 - 36 seconds  Occult blood card to lab, stool RN will collect     Status: None   Collection Time: 04/22/16  1:40 PM  Result Value Ref Range   Fecal Occult Bld NEGATIVE NEGATIVE  CBC     Status: Abnormal   Collection Time: 04/23/16  5:26 AM  Result Value Ref Range   WBC 5.6 4.0 - 10.5 K/uL   RBC 3.10 (L) 4.22 - 5.81 MIL/uL   Hemoglobin 9.7 (L) 13.0 - 17.0 g/dL   HCT 30.2 (L) 39.0 - 52.0 %   MCV 97.4 78.0 - 100.0 fL   MCH 31.3 26.0 - 34.0 pg   MCHC 32.1 30.0 - 36.0 g/dL   RDW 12.3 11.5 - 15.5 %   Platelets 338 150 - 400 K/uL  Protime-INR     Status: None   Collection Time: 04/23/16  5:26 AM  Result Value Ref Range   Prothrombin Time 13.7 11.4 - 15.2 seconds   INR 1.05   Protime-INR     Status: Abnormal   Collection Time: 04/24/16  6:09 AM  Result Value Ref Range   Prothrombin Time 16.7 (H) 11.4 - 15.2 seconds   INR 9.76   Basic metabolic panel     Status: Abnormal   Collection Time: 04/24/16  6:09 AM  Result Value Ref Range     Sodium 141 135 - 145 mmol/L   Potassium 3.5 3.5 - 5.1 mmol/L   Chloride 102 101 - 111 mmol/L   CO2 31 22 - 32 mmol/L   Glucose, Bld 97 65 - 99 mg/dL   BUN 14 6 - 20 mg/dL   Creatinine, Ser 0.77 0.61 - 1.24 mg/dL   Calcium 8.7 (L) 8.9 - 10.3 mg/dL   GFR calc non Af Amer >60 >60 mL/min   GFR calc Af Amer >60 >60 mL/min    Comment: (NOTE) The eGFR has been calculated using the CKD EPI equation. This calculation has not been validated in all clinical situations. eGFR's persistently <60 mL/min signify possible Chronic Kidney Disease.    Anion gap 8 5 - 15  CBC     Status: Abnormal   Collection Time: 04/24/16  6:09 AM  Result Value Ref Range   WBC 6.1 4.0 - 10.5 K/uL   RBC 3.34 (L) 4.22 - 5.81 MIL/uL   Hemoglobin 10.6 (L) 13.0 - 17.0 g/dL   HCT 32.5 (L) 39.0 -  52.0 %   MCV 97.3 78.0 - 100.0 fL   MCH 31.7 26.0 - 34.0 pg   MCHC 32.6 30.0 - 36.0 g/dL   RDW 12.5 11.5 - 15.5 %   Platelets 381 150 - 400 K/uL  INR/PT     Status: Abnormal   Collection Time: 04/28/16 11:16 AM  Result Value Ref Range   INR 3.1 (H) 0.8 - 1.0 ratio   Prothrombin Time 34.0 (H) 9.6 - 13.1 sec  CBC w/Diff     Status: Abnormal   Collection Time: 04/28/16 11:16 AM  Result Value Ref Range   WBC 7.0 4.0 - 10.5 K/uL   RBC 3.32 (L) 4.22 - 5.81 Mil/uL   Hemoglobin 10.8 (L) 13.0 - 17.0 g/dL   HCT 32.9 (L) 39.0 - 52.0 %   MCV 99.2 78.0 - 100.0 fl   MCHC 32.8 30.0 - 36.0 g/dL   RDW 13.4 11.5 - 15.5 %   Platelets 427.0 (H) 150.0 - 400.0 K/uL   Neutrophils Relative % 72.5 43.0 - 77.0 %   Lymphocytes Relative 15.0 12.0 - 46.0 %   Monocytes Relative 8.0 3.0 - 12.0 %   Eosinophils Relative 3.8 0.0 - 5.0 %   Basophils Relative 0.7 0.0 - 3.0 %   Neutro Abs 5.1 1.4 - 7.7 K/uL   Lymphs Abs 1.1 0.7 - 4.0 K/uL   Monocytes Absolute 0.6 0.1 - 1.0 K/uL   Eosinophils Absolute 0.3 0.0 - 0.7 K/uL   Basophils Absolute 0.0 0.0 - 0.1 K/uL  Comp Met (CMET)     Status: Abnormal   Collection Time: 04/28/16 11:16 AM  Result  Value Ref Range   Sodium 142 135 - 145 mEq/L   Potassium 4.0 3.5 - 5.1 mEq/L   Chloride 106 96 - 112 mEq/L   CO2 30 19 - 32 mEq/L   Glucose, Bld 75 70 - 99 mg/dL   BUN 25 (H) 6 - 23 mg/dL   Creatinine, Ser 0.81 0.40 - 1.50 mg/dL   Total Bilirubin 0.2 0.2 - 1.2 mg/dL   Alkaline Phosphatase 80 39 - 117 U/L   AST 28 0 - 37 U/L   ALT 29 0 - 53 U/L   Total Protein 7.3 6.0 - 8.3 g/dL   Albumin 3.6 3.5 - 5.2 g/dL   Calcium 8.0 (L) 8.4 - 10.5 mg/dL   GFR 103.74 >60.00 mL/min  CBC with Differential [facility])     Status: Abnormal   Collection Time: 05/28/16  2:12 PM  Result Value Ref Range   WBC 6.7 4.0 - 10.0 10e3/uL   RBC 3.44 (L) 4.20 - 5.70 10e6/uL   HGB 11.5 (L) 13.0 - 17.1 g/dL   HCT 33.9 (L) 38.7 - 49.9 %   MCV 99 (H) 82 - 98 fL   MCH 33.4 28.0 - 33.4 pg   MCHC 33.9 32.0 - 35.9 g/dL   RDW 12.3 11.1 - 15.7 %   Platelets 314 145 - 400 10e3/uL   NEUT# 4.2 1.5 - 6.5 10e3/uL   LYMPH# 1.4 0.9 - 3.3 10e3/uL   MONO# 0.8 0.1 - 0.9 10e3/uL   Eosinophils Absolute 0.2 0.0 - 0.5 10e3/uL   BASO# 0.0 0.0 - 0.2 10e3/uL   NEUT% 62.8 40.0 - 80.0 %   LYMPH% 21.6 14.0 - 48.0 %   MONO% 11.7 0.0 - 13.0 %   EOS% 3.3 0.0 - 7.0 %   BASO% 0.6 0.0 - 2.0 %  Comprehensive metabolic panel     Status:  Abnormal   Collection Time: 05/28/16  2:12 PM  Result Value Ref Range   Glucose 94 65 - 99 mg/dL   BUN 23 6 - 24 mg/dL   Creatinine, Ser 0.82 0.76 - 1.27 mg/dL   GFR calc non Af Amer 97 >59 mL/min/1.73   GFR calc Af Amer 113 >59 mL/min/1.73   BUN/Creatinine Ratio 28 (H) 9 - 20   Sodium 138 134 - 144 mmol/L   Potassium, Ser 3.3 (L) 3.5 - 5.2 mmol/L   Chloride, Ser 102 96 - 106 mmol/L   Carbon Dioxide, Total 30 (H) 18 - 29 mmol/L   Calcium, Ser 8.0 (L) 8.7 - 10.2 mg/dL   Total Protein 7.5 6.0 - 8.5 g/dL   Albumin, Serum 3.7 3.5 - 5.5 g/dL   Globulin, Total 3.8 1.5 - 4.5 g/dL   Albumin/Globulin Ratio 1.0 (L) 1.2 - 2.2   Bilirubin Total 0.2 0.0 - 1.2 mg/dL   Alkaline Phosphatase, S 99 39 - 117  IU/L   AST (SGOT) 22 0 - 40 IU/L   ALT 20 0 - 44 IU/L  Protime INR     Status: Abnormal   Collection Time: 05/28/16  3:43 PM  Result Value Ref Range   Protime 50.4 (H) 10.6 - 13.4 Seconds   INR 4.2 (H) 2.0 - 3.5    Comment: INR is useful only to assess adequacy of anticoagulation with coumadin when comparing results from different labs. It should not be used to estimate bleeding risk or presence/abscense of coagulopathy in patients not on coumadin. Expected INR ranges for  nontherapeutic patients is 0.88 - 1.12.    Lovenox No     US Venous Img Lower Unilateral Right  Result Date: 04/22/2016 CLINICAL DATA:  Acute right lower extremity swelling and pain for 3 days EXAM: RIGHT LOWER EXTREMITY VENOUS DOPPLER ULTRASOUND TECHNIQUE: Gray-scale sonography with graded compression, as well as color Doppler and duplex ultrasound were performed to evaluate the lower extremity deep venous systems from the level of the common femoral vein and including the common femoral, femoral, profunda femoral, popliteal and calf veins including the posterior tibial, peroneal and gastrocnemius veins when visible. The superficial great saphenous vein was also interrogated. Spectral Doppler was utilized to evaluate flow at rest and with distal augmentation maneuvers in the common femoral, femoral and popliteal veins. COMPARISON:  None. FINDINGS: Contralateral Common Femoral Vein: Acute hypoechoic intraluminal thrombus within the common femoral vein appearing adherent to a valve, with some mobility noted during respiratory phasicity. Thrombus is nonocclusive at this level with partial compressibility noted. Common Femoral Vein: Acute hypoechoic intraluminal thrombus. Partially occlusive and partially compressible. Saphenofemoral Junction: Small amount of thrombus extends into the SFJ. Profunda Femoral Vein: No evidence of thrombus. Normal compressibility and flow on color Doppler imaging. Femoral Vein: Diffuse acute hypoechoic  intraluminal thrombus. Thrombus appears occlusive. Vessel is noncompressible. No detectable flow. Popliteal Vein: Similar diffuse hypoechoic intraluminal thrombus appearing acute and occlusive. Vessel is noncompressible. No detectable flow. Calf Veins: Thrombus does appear to propagate into the tibial and peroneal calf veins as well as the gastrocnemius vein. Superficial Great Saphenous Vein: No evidence of thrombus. Normal compressibility and flow on color Doppler imaging. Venous Reflux:  None. Other Findings:  None. IMPRESSION: Extensive acute occlusive DVT within the right femoral, popliteal and calf veins. These results will be called to the ordering clinician or representative by the Radiologist Assistant, and communication documented in the PACS or zVision Dashboard. Electronically Signed   By: Jerilynn Mages.  Shick M.D.  On: 04/22/2016 11:18     ROS: 14 pt review of systems performed and negative (unless mentioned in an HPI)  Objective: BP (!) 145/84 (BP Location: Right Arm, Patient Position: Sitting, Cuff Size: Normal)   Pulse 60   Temp 97.7 F (36.5 C)   Resp 20   Ht 5' 9"  (1.753 m)   Wt 129 lb 8 oz (58.7 kg)   SpO2 99%   BMI 19.12 kg/m  Gen: Afebrile. No acute distress. Nontoxic in appearance, well-developed,thin pleasant caucasian male. Mild cognitive delay.  Neuro/Msk: Normal gait. PERLA. EOMi. Alert. Oriented x3.   Right hip: no erythema, bruising or swelling. No TTP ant hip joint or right SI. No Pain  With movement, NROM. NV intact distally.   Assessment/plan: Kentley Blyden is a 60 y.o. male present for  Right hip pain/SI pain - Hip xray normal.  - Continue daily activity but caution with lifting. Discussed proper body mechanics with lifting.   - F/U PRN   Electronically signed by: Howard Pouch, DO [place]

## 2016-05-30 LAB — CARDIOLIPIN ANTIBODIES, IGG, IGM, IGA: Anticardiolipin Ab,IgA,Qn: <9 APL U/mL (ref 0–11)

## 2016-05-30 LAB — PROTEIN C ACTIVITY: PROTEIN C ACTIVITY: 65 % — AB (ref 73–180)

## 2016-05-30 LAB — LUPUS ANTICOAGULANT PANEL
DRVVT MIX: 44 s (ref 0.0–47.0)
DRVVT: 83.1 s — AB (ref 0.0–47.0)
Hexagonal Phase Phospholipid: 7 s (ref 0–11)
PTT-LA Incub Mix: 61.9 s — ABNORMAL HIGH (ref 0.0–48.9)
PTT-LA Mix: 48.4 s (ref 0.0–48.9)
PTT-LA: 77.3 s — ABNORMAL HIGH (ref 0.0–51.9)

## 2016-05-30 LAB — PROTEIN S, TOTAL: PROTEIN S AG TOTAL: 40 % — AB (ref 60–150)

## 2016-05-30 LAB — ANTITHROMBIN III: ANTITHROMBIN ACTIVITY: 128 % (ref 75–135)

## 2016-05-30 LAB — BETA-2-GLYCOPROTEIN I ABS, IGG/M/A: Beta-2 Glyco 1 IgM: <9 GPI IgM units (ref 0–32)

## 2016-05-30 LAB — PROTEIN S ACTIVITY

## 2016-06-02 LAB — PROTHROMBIN GENE MUTATION

## 2016-06-03 LAB — FACTOR 5 LEIDEN

## 2016-06-04 ENCOUNTER — Telehealth: Payer: Self-pay | Admitting: *Deleted

## 2016-06-04 ENCOUNTER — Other Ambulatory Visit (HOSPITAL_BASED_OUTPATIENT_CLINIC_OR_DEPARTMENT_OTHER): Payer: Managed Care, Other (non HMO)

## 2016-06-04 DIAGNOSIS — I82411 Acute embolism and thrombosis of right femoral vein: Secondary | ICD-10-CM

## 2016-06-04 LAB — PROTIME-INR (CHCC SATELLITE)
INR: 4.6 — AB (ref 2.0–3.5)
Protime: 55.2 Seconds — ABNORMAL HIGH (ref 10.6–13.4)

## 2016-06-04 NOTE — Telephone Encounter (Signed)
-----   Message from Volanda Napoleon, MD sent at 06/04/2016  3:04 PM EDT ----- Call his brother - Your brother does have a blood clotting problem!!  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  Sorry about the Kos[place]!!!  No pitching!! pete

## 2016-06-05 ENCOUNTER — Other Ambulatory Visit: Payer: Self-pay | Admitting: *Deleted

## 2016-06-05 ENCOUNTER — Telehealth: Payer: Self-pay | Admitting: *Deleted

## 2016-06-05 DIAGNOSIS — I82411 Acute embolism and thrombosis of right femoral vein: Secondary | ICD-10-CM

## 2016-06-05 NOTE — Telephone Encounter (Addendum)
Patient scheduled tomorrow  ----- Message from Volanda Napoleon, MD sent at 06/04/2016  6:42 PM EDT ----- WHEN IS HIS NEXT INR CHECK??  He needs one on Friday!!  Call his brother!!! pete

## 2016-06-06 ENCOUNTER — Other Ambulatory Visit (HOSPITAL_BASED_OUTPATIENT_CLINIC_OR_DEPARTMENT_OTHER): Payer: Managed Care, Other (non HMO)

## 2016-06-06 ENCOUNTER — Other Ambulatory Visit: Payer: Managed Care, Other (non HMO)

## 2016-06-06 ENCOUNTER — Telehealth: Payer: Self-pay | Admitting: *Deleted

## 2016-06-06 DIAGNOSIS — I82411 Acute embolism and thrombosis of right femoral vein: Secondary | ICD-10-CM

## 2016-06-06 LAB — PROTIME-INR (CHCC SATELLITE)
INR: 3.3 (ref 2.0–3.5)
Protime: 39.6 Seconds — ABNORMAL HIGH (ref 10.6–13.4)

## 2016-06-06 NOTE — Telephone Encounter (Addendum)
Patient's brother aware of results  ----- Message from Volanda Napoleon, MD sent at 06/06/2016  2:38 PM EDT ----- Call - INR is perfect!!  No change in the coumadin dose!!  pete

## 2016-06-11 ENCOUNTER — Other Ambulatory Visit (HOSPITAL_BASED_OUTPATIENT_CLINIC_OR_DEPARTMENT_OTHER): Payer: Managed Care, Other (non HMO)

## 2016-06-11 DIAGNOSIS — I82411 Acute embolism and thrombosis of right femoral vein: Secondary | ICD-10-CM

## 2016-06-11 LAB — PROTIME-INR (CHCC SATELLITE)
INR: 4.1 — ABNORMAL HIGH (ref 2.0–3.5)
Protime: 49.2 Seconds — ABNORMAL HIGH (ref 10.6–13.4)

## 2016-06-12 ENCOUNTER — Telehealth: Payer: Self-pay | Admitting: *Deleted

## 2016-06-12 NOTE — Telephone Encounter (Addendum)
Spoke with patient's brother. He understands to hold the coumadin for 2 days. He will then restart as instructed. Teaching confirmed with teach back. They are alreadys scheduled for lab work.   ----- Message from Volanda Napoleon, MD sent at 06/11/2016  2:26 PM EDT ----- Call his brother -- INR is too high!!  Do not take coumadin for next 2 days.  Then alternate 5 mg with 2.5 mg.  Need to repeat INR in 2 weeks.  pete

## 2016-06-18 ENCOUNTER — Other Ambulatory Visit (HOSPITAL_BASED_OUTPATIENT_CLINIC_OR_DEPARTMENT_OTHER): Payer: Managed Care, Other (non HMO)

## 2016-06-18 DIAGNOSIS — I82411 Acute embolism and thrombosis of right femoral vein: Secondary | ICD-10-CM

## 2016-06-18 LAB — PROTIME-INR (CHCC SATELLITE)
INR: 3.2 (ref 2.0–3.5)
PROTIME: 38.4 s — AB (ref 10.6–13.4)

## 2016-06-19 ENCOUNTER — Telehealth: Payer: Self-pay

## 2016-06-19 NOTE — Telephone Encounter (Addendum)
-----   Message from Volanda Napoleon, MD sent at 06/18/2016  4:59 PM EDT ----- Call - INR is perfect!!!  No change in coumadin dose!!  pete  Above message given to pt's brother Mikki Santee who confirms Coumadin dosing and repeats back instructions. dph

## 2016-06-25 ENCOUNTER — Other Ambulatory Visit (HOSPITAL_BASED_OUTPATIENT_CLINIC_OR_DEPARTMENT_OTHER): Payer: Managed Care, Other (non HMO)

## 2016-06-25 DIAGNOSIS — I82411 Acute embolism and thrombosis of right femoral vein: Secondary | ICD-10-CM

## 2016-06-25 LAB — PROTIME-INR (CHCC SATELLITE)
INR: 4.8 — AB (ref 2.0–3.5)
PROTIME: 57.6 s — AB (ref 10.6–13.4)

## 2016-07-02 ENCOUNTER — Other Ambulatory Visit: Payer: Self-pay | Admitting: Family

## 2016-07-02 ENCOUNTER — Other Ambulatory Visit (HOSPITAL_BASED_OUTPATIENT_CLINIC_OR_DEPARTMENT_OTHER): Payer: Managed Care, Other (non HMO)

## 2016-07-02 ENCOUNTER — Ambulatory Visit (HOSPITAL_BASED_OUTPATIENT_CLINIC_OR_DEPARTMENT_OTHER): Payer: Managed Care, Other (non HMO) | Admitting: Family

## 2016-07-02 VITALS — BP 152/66 | HR 53 | Temp 97.8°F | Resp 20 | Wt 131.1 lb

## 2016-07-02 DIAGNOSIS — I82411 Acute embolism and thrombosis of right femoral vein: Secondary | ICD-10-CM

## 2016-07-02 DIAGNOSIS — Z7901 Long term (current) use of anticoagulants: Secondary | ICD-10-CM

## 2016-07-02 LAB — CBC WITH DIFFERENTIAL (CANCER CENTER ONLY)
BASO#: 0 10*3/uL (ref 0.0–0.2)
BASO%: 0.5 % (ref 0.0–2.0)
EOS ABS: 0.2 10*3/uL (ref 0.0–0.5)
EOS%: 3.1 % (ref 0.0–7.0)
HCT: 32.4 % — ABNORMAL LOW (ref 38.7–49.9)
HEMOGLOBIN: 10.9 g/dL — AB (ref 13.0–17.1)
LYMPH#: 1 10*3/uL (ref 0.9–3.3)
LYMPH%: 16.7 % (ref 14.0–48.0)
MCH: 32.9 pg (ref 28.0–33.4)
MCHC: 33.6 g/dL (ref 32.0–35.9)
MCV: 98 fL (ref 82–98)
MONO#: 0.5 10*3/uL (ref 0.1–0.9)
MONO%: 8.4 % (ref 0.0–13.0)
NEUT%: 71.3 % (ref 40.0–80.0)
NEUTROS ABS: 4.3 10*3/uL (ref 1.5–6.5)
Platelets: 297 10*3/uL (ref 145–400)
RBC: 3.31 10*6/uL — AB (ref 4.20–5.70)
RDW: 12.6 % (ref 11.1–15.7)
WBC: 6.1 10*3/uL (ref 4.0–10.0)

## 2016-07-02 LAB — PROTIME-INR (CHCC SATELLITE)
INR: 4.8 — ABNORMAL HIGH (ref 2.0–3.5)
Protime: 57.6 Seconds — ABNORMAL HIGH (ref 10.6–13.4)

## 2016-07-02 NOTE — Progress Notes (Signed)
Hematology and Oncology Follow Up Visit  Rick Walsh [id number] Nov 28, 1955 60 y.o. 07/02/2016   Principle Diagnosis:  DVT of the right femoral, popliteal and calf veins  Current Therapy:   Coumadin 5 mg PO daily M, W, F, Sat, Sun and 2.5 mg PO daily T,Th     Interim History:  Mr. Przybylski is here today with his brother for follow-up. His INR is 4.8. He has had no issues with bleeding, bruising or petechiae. He verbalized taking his coumadin as listed above.  He still has some swelling around the right ankle and some occasional "tingling" in his right thigh that comes and goes. Pedal pulses are +2. No weakness or numbness in his extremities.  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 Yesterday he tripped over a box of gourds and fell. Thankfully, he was not injured and had no cut or bruise to the left knee where he landed.  No fever, chills, n/v, cough, rash, dizziness, SOB, chest pain, palpitations, abdominal pain or changes in bowel or bladder habits.  No syncope or seizures.  He has maintained a good appetite and is staying well hydrated. His weight is stable.   Medications:    Medication List       Accurate as of 07/02/16  3:51 PM. Always use your most recent med list.          alendronate 70 MG tablet Commonly known as:  FOSAMAX Take 70 mg by mouth once a week. Take with a full glass of water on an empty stomach.   atenolol 25 MG tablet Commonly known as:  TENORMIN Take 25 mg by mouth daily.   cyanocobalamin 1000 MCG/ML injection Commonly known as:  (VITAMIN B-12) Inject 1,000 mcg into the muscle every 30 (thirty) days.   levETIRAcetam 500 MG tablet Commonly known as:  KEPPRA Take 500 mg by mouth 2 (two) times daily.   multivitamin with minerals Tabs tablet Take 1 tablet by mouth daily.   phenytoin 100 MG ER capsule Commonly known as:  DILANTIN Take 200 mg by mouth 2 (two) times daily.   potassium chloride 10 MEQ CR capsule Commonly  known as:  MICRO-K Take 10 mEq by mouth daily.   Vitamin D3 5000 units Caps Take 1 capsule by mouth daily.   warfarin 5 MG tablet Commonly known as:  COUMADIN Take 1 tablet (5 mg total) by mouth daily at 6 PM.       Allergies:  Allergies  Allergen Reactions  . Bee Venom Shortness Of Breath and Swelling  . Phenytoin Sodium Nausea And Vomiting and Rash    MUST HAVE NAME BRAND DILANTIN    Past Medical History, Surgical history, Social history, and Family History were reviewed and updated.  Review of Systems: All other 10 point review of systems is negative.   Physical Exam:  weight is 131 lb 1.9 oz (59.5 kg). His oral temperature is 97.8 F (36.6 C). His blood pressure is 152/66 (abnormal) and his pulse is 53 (abnormal). His respiration is 20.   Wt Readings from Last 3 Encounters:  07/02/16 131 lb 1.9 oz (59.5 kg)  05/29/16 129 lb 8 oz (58.7 kg)  05/28/16 130 lb 12.8 oz (59.3 kg)    Ocular: Sclerae unicteric, pupils equal, round and reactive to light Ear-nose-throat: Oropharynx clear, dentition fair Lymphatic: No cervical supraclavicular or axillary adenopathy Lungs no rales or rhonchi, good excursion bilaterally Heart regular rate and rhythm, no murmur appreciated Abd soft, nontender, positive  bowel sounds, no liver or spleen tip palpated on exam, no fluid wave MSK no focal spinal tenderness, no joint edema Neuro: non-focal, well-oriented, appropriate affect Breasts: Deferred  Lab Results  Component Value Date   WBC 6.1 07/02/2016   HGB 10.9 (L) 07/02/2016   HCT 32.4 (L) 07/02/2016   MCV 98 07/02/2016   PLT 297 07/02/2016   No results found for: FERRITIN, IRON, TIBC, UIBC, IRONPCTSAT Lab Results  Component Value Date   RBC 3.31 (L) 07/02/2016   No results found for: KPAFRELGTCHN, LAMBDASER, KAPLAMBRATIO No results found for: IGGSERUM, IGA, IGMSERUM No results found for: Ronnald Ramp, A1GS, Nelida Meuse, SPEI   Chemistry       Component Value Date/Time   NA 138 05/28/2016 1412   K 3.3 (L) 05/28/2016 1412   CL 102 05/28/2016 1412   CO2 30 (H) 05/28/2016 1412   BUN 23 05/28/2016 1412   CREATININE 0.82 05/28/2016 1412      Component Value Date/Time   CALCIUM 8.0 (L) 05/28/2016 1412   ALKPHOS 99 05/28/2016 1412   AST 22 05/28/2016 1412   ALT 20 05/28/2016 1412   BILITOT 0.2 05/28/2016 1412     Impression and Plan: Mr. 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 He is currently on Coumadin  5 mg PO PO daily M, W, F, Sat, Sun and 2.5 mg PO daily T,Th.  His INR today is 4.8. He has had no issues with bleeding or bruising.  We will have him hold his coumadin today and tomorrow (11/1 and 11/2) and resume Coumadin Friday at 5 mg PO daily F, Sun, T, Th and 2.5 mg PO daily Sat, M,W. We will then repeat an INR on Monday and see if this has improved. Then follow with weekly INR as before.  We will then plan to see him back for MD follow-up in 5 weeks and repeat a doppler study of the right lower extremity that same day.   Both he and his family know to contact our office with any questions or concerns. We can certainly see him sooner if need be.   Eliezer Bottom, NP 11/1/20173:51 PM

## 2016-07-05 ENCOUNTER — Other Ambulatory Visit: Payer: Self-pay | Admitting: Family Medicine

## 2016-07-07 ENCOUNTER — Other Ambulatory Visit: Payer: Self-pay | Admitting: Family

## 2016-07-07 DIAGNOSIS — I82411 Acute embolism and thrombosis of right femoral vein: Secondary | ICD-10-CM

## 2016-07-07 MED ORDER — WARFARIN SODIUM 5 MG PO TABS: 5.0000 mg | ORAL_TABLET | Freq: Every day | ORAL | 0 refills | Status: DC

## 2016-07-08 ENCOUNTER — Other Ambulatory Visit: Payer: Self-pay | Admitting: Family

## 2016-07-08 ENCOUNTER — Other Ambulatory Visit (HOSPITAL_BASED_OUTPATIENT_CLINIC_OR_DEPARTMENT_OTHER): Payer: Managed Care, Other (non HMO)

## 2016-07-08 ENCOUNTER — Telehealth: Payer: Self-pay

## 2016-07-08 DIAGNOSIS — I82411 Acute embolism and thrombosis of right femoral vein: Secondary | ICD-10-CM

## 2016-07-08 LAB — PROTIME-INR (CHCC SATELLITE)
INR: 2.2 (ref 2.0–3.5)
Protime: 26.4 Seconds — ABNORMAL HIGH (ref 10.6–13.4)

## 2016-07-08 NOTE — Telephone Encounter (Addendum)
-----   Message from Rick Napoleon, MD sent at 07/08/2016  1:45 PM EST ----- Call - INR is perfect!!  Laurey Arrow  Above message provided to pt's brother Mikki Santee who verbalizes understanding. dph

## 2016-07-14 ENCOUNTER — Other Ambulatory Visit: Payer: Self-pay | Admitting: *Deleted

## 2016-07-14 DIAGNOSIS — I82411 Acute embolism and thrombosis of right femoral vein: Secondary | ICD-10-CM

## 2016-07-15 ENCOUNTER — Telehealth: Payer: Self-pay | Admitting: *Deleted

## 2016-07-15 ENCOUNTER — Other Ambulatory Visit: Payer: Managed Care, Other (non HMO)

## 2016-07-15 DIAGNOSIS — I82411 Acute embolism and thrombosis of right femoral vein: Secondary | ICD-10-CM

## 2016-07-15 LAB — CBC WITH DIFFERENTIAL (CANCER CENTER ONLY)
BASO#: 0 10*3/uL (ref 0.0–0.2)
BASO%: 0.6 % (ref 0.0–2.0)
EOS ABS: 0.2 10*3/uL (ref 0.0–0.5)
EOS%: 2.5 % (ref 0.0–7.0)
HEMATOCRIT: 33.8 % — AB (ref 38.7–49.9)
HEMOGLOBIN: 11.6 g/dL — AB (ref 13.0–17.1)
LYMPH#: 1 10*3/uL (ref 0.9–3.3)
LYMPH%: 14.2 % (ref 14.0–48.0)
MCH: 33.4 pg (ref 28.0–33.4)
MCHC: 34.3 g/dL (ref 32.0–35.9)
MCV: 97 fL (ref 82–98)
MONO#: 0.7 10*3/uL (ref 0.1–0.9)
MONO%: 10.7 % (ref 0.0–13.0)
NEUT%: 72 % (ref 40.0–80.0)
NEUTROS ABS: 4.9 10*3/uL (ref 1.5–6.5)
Platelets: 304 10*3/uL (ref 145–400)
RBC: 3.47 10*6/uL — ABNORMAL LOW (ref 4.20–5.70)
RDW: 12.4 % (ref 11.1–15.7)
WBC: 6.7 10*3/uL (ref 4.0–10.0)

## 2016-07-15 LAB — PROTHROMBIN TIME (PT)
INR: 5.2 — ABNORMAL HIGH (ref 0.8–1.2)
Prothrombin Time: 49 s — ABNORMAL HIGH (ref 9.1–12.0)

## 2016-07-15 LAB — PROTIME-INR (CHCC SATELLITE)

## 2016-07-15 NOTE — Telephone Encounter (Addendum)
Patient aware of results  ----- Message from Volanda Napoleon, MD sent at 07/15/2016  1:34 PM EST ----- Call his brother -- the INR is perfect.  pete

## 2016-07-16 ENCOUNTER — Telehealth: Payer: Self-pay | Admitting: *Deleted

## 2016-07-16 NOTE — Telephone Encounter (Addendum)
Patient's brother aware of results. He will hold coumadin until Saturday. The patients current regimen is 5mg  coumadin alternating with 2.5mg  daily.   Dr Marin Olp would like patient to start taking 2.5mg  daily when he restarts the coumadin. Brother understands this instruction, verified with teachback.   ----- Message from Volanda Napoleon, MD sent at 07/16/2016  6:39 AM EST ----- Call his brother today!!! The INR is way too high -- 5.2.  NO coumadin for 3 days.  How much coumadin is he on??  Laurey Arrow

## 2016-07-21 ENCOUNTER — Other Ambulatory Visit: Payer: Self-pay | Admitting: *Deleted

## 2016-07-21 DIAGNOSIS — I82411 Acute embolism and thrombosis of right femoral vein: Secondary | ICD-10-CM

## 2016-07-22 ENCOUNTER — Other Ambulatory Visit (HOSPITAL_BASED_OUTPATIENT_CLINIC_OR_DEPARTMENT_OTHER): Payer: Managed Care, Other (non HMO)

## 2016-07-22 ENCOUNTER — Other Ambulatory Visit: Payer: Self-pay | Admitting: Family

## 2016-07-22 DIAGNOSIS — I82411 Acute embolism and thrombosis of right femoral vein: Secondary | ICD-10-CM | POA: Diagnosis not present

## 2016-07-22 LAB — CBC WITH DIFFERENTIAL (CANCER CENTER ONLY)
BASO#: 0 10*3/uL (ref 0.0–0.2)
BASO%: 0.5 % (ref 0.0–2.0)
EOS%: 2.6 % (ref 0.0–7.0)
Eosinophils Absolute: 0.2 10*3/uL (ref 0.0–0.5)
HCT: 34.6 % — ABNORMAL LOW (ref 38.7–49.9)
HGB: 11.6 g/dL — ABNORMAL LOW (ref 13.0–17.1)
LYMPH#: 0.9 10*3/uL (ref 0.9–3.3)
LYMPH%: 13.3 % — AB (ref 14.0–48.0)
MCH: 33 pg (ref 28.0–33.4)
MCHC: 33.5 g/dL (ref 32.0–35.9)
MCV: 98 fL (ref 82–98)
MONO#: 0.8 10*3/uL (ref 0.1–0.9)
MONO%: 12.7 % (ref 0.0–13.0)
NEUT#: 4.7 10*3/uL (ref 1.5–6.5)
NEUT%: 70.9 % (ref 40.0–80.0)
PLATELETS: 308 10*3/uL (ref 145–400)
RBC: 3.52 10*6/uL — ABNORMAL LOW (ref 4.20–5.70)
RDW: 12.3 % (ref 11.1–15.7)
WBC: 6.6 10*3/uL (ref 4.0–10.0)

## 2016-07-22 LAB — PROTIME-INR (CHCC SATELLITE)
INR: 1.4 — AB (ref 2.0–3.5)
PROTIME: 16.8 s — AB (ref 10.6–13.4)

## 2016-07-22 NOTE — Progress Notes (Signed)
We will have him change his Coumadin dosage to 1/2 pill (2.5 mg) by mouth every day but Tuesdays and Thursday where he will take 1 pill (5mg ) by mouth daily. He verbalized understanding and is in agreement with the plan. We will see him back next week for repeat lab work and see how things look.

## 2016-07-28 ENCOUNTER — Other Ambulatory Visit: Payer: Self-pay | Admitting: *Deleted

## 2016-07-28 DIAGNOSIS — I82411 Acute embolism and thrombosis of right femoral vein: Secondary | ICD-10-CM

## 2016-07-29 ENCOUNTER — Telehealth: Payer: Self-pay | Admitting: *Deleted

## 2016-07-29 ENCOUNTER — Other Ambulatory Visit (HOSPITAL_BASED_OUTPATIENT_CLINIC_OR_DEPARTMENT_OTHER): Payer: Managed Care, Other (non HMO)

## 2016-07-29 DIAGNOSIS — I82411 Acute embolism and thrombosis of right femoral vein: Secondary | ICD-10-CM | POA: Diagnosis not present

## 2016-07-29 LAB — CBC WITH DIFFERENTIAL (CANCER CENTER ONLY)
BASO#: 0 10*3/uL (ref 0.0–0.2)
BASO%: 0.3 % (ref 0.0–2.0)
EOS ABS: 0.2 10*3/uL (ref 0.0–0.5)
EOS%: 3 % (ref 0.0–7.0)
HEMATOCRIT: 34.3 % — AB (ref 38.7–49.9)
HEMOGLOBIN: 11.6 g/dL — AB (ref 13.0–17.1)
LYMPH#: 1 10*3/uL (ref 0.9–3.3)
LYMPH%: 15.2 % (ref 14.0–48.0)
MCH: 33.2 pg (ref 28.0–33.4)
MCHC: 33.8 g/dL (ref 32.0–35.9)
MCV: 98 fL (ref 82–98)
MONO#: 0.6 10*3/uL (ref 0.1–0.9)
MONO%: 9.7 % (ref 0.0–13.0)
NEUT%: 71.8 % (ref 40.0–80.0)
NEUTROS ABS: 4.7 10*3/uL (ref 1.5–6.5)
Platelets: 310 10*3/uL (ref 145–400)
RBC: 3.49 10*6/uL — ABNORMAL LOW (ref 4.20–5.70)
RDW: 12.4 % (ref 11.1–15.7)
WBC: 6.6 10*3/uL (ref 4.0–10.0)

## 2016-07-29 LAB — PROTIME-INR (CHCC SATELLITE)
INR: 2 (ref 2.0–3.5)
Protime: 24 Seconds — ABNORMAL HIGH (ref 10.6–13.4)

## 2016-07-29 NOTE — Telephone Encounter (Addendum)
Patient aware of results and medication instruction  ----- Message from Volanda Napoleon, MD sent at 07/29/2016  1:57 PM EST ----- Call - NO change in coumadin dose.  pete

## 2016-08-06 ENCOUNTER — Other Ambulatory Visit: Payer: Self-pay | Admitting: Family

## 2016-08-06 ENCOUNTER — Other Ambulatory Visit (HOSPITAL_BASED_OUTPATIENT_CLINIC_OR_DEPARTMENT_OTHER): Payer: Managed Care, Other (non HMO)

## 2016-08-06 ENCOUNTER — Ambulatory Visit (HOSPITAL_BASED_OUTPATIENT_CLINIC_OR_DEPARTMENT_OTHER): Payer: Managed Care, Other (non HMO) | Admitting: Hematology & Oncology

## 2016-08-06 ENCOUNTER — Ambulatory Visit (HOSPITAL_BASED_OUTPATIENT_CLINIC_OR_DEPARTMENT_OTHER): Payer: Medicare (Managed Care)

## 2016-08-06 VITALS — BP 139/70 | HR 65 | Temp 97.4°F | Resp 20 | Wt 132.0 lb

## 2016-08-06 DIAGNOSIS — Z7901 Long term (current) use of anticoagulants: Secondary | ICD-10-CM | POA: Diagnosis not present

## 2016-08-06 DIAGNOSIS — I82411 Acute embolism and thrombosis of right femoral vein: Secondary | ICD-10-CM

## 2016-08-06 DIAGNOSIS — I82401 Acute embolism and thrombosis of unspecified deep veins of right lower extremity: Secondary | ICD-10-CM | POA: Diagnosis not present

## 2016-08-06 DIAGNOSIS — I82431 Acute embolism and thrombosis of right popliteal vein: Secondary | ICD-10-CM | POA: Diagnosis not present

## 2016-08-06 LAB — PROTIME-INR (CHCC SATELLITE)
INR: 3.9 — ABNORMAL HIGH (ref 2.0–3.5)
Protime: 46.8 s — ABNORMAL HIGH (ref 10.6–13.4)

## 2016-08-06 LAB — CBC WITH DIFFERENTIAL (CANCER CENTER ONLY)
BASO#: 0 10*3/uL (ref 0.0–0.2)
BASO%: 0.2 % (ref 0.0–2.0)
EOS ABS: 0.1 10*3/uL (ref 0.0–0.5)
EOS%: 1.5 % (ref 0.0–7.0)
HEMATOCRIT: 35.2 % — AB (ref 38.7–49.9)
HEMOGLOBIN: 12 g/dL — AB (ref 13.0–17.1)
LYMPH#: 1.2 10*3/uL (ref 0.9–3.3)
LYMPH%: 12 % — ABNORMAL LOW (ref 14.0–48.0)
MCH: 33.4 pg (ref 28.0–33.4)
MCHC: 34.1 g/dL (ref 32.0–35.9)
MCV: 98 fL (ref 82–98)
MONO#: 1.3 10*3/uL — ABNORMAL HIGH (ref 0.1–0.9)
MONO%: 13.4 % — AB (ref 0.0–13.0)
NEUT%: 72.9 % (ref 40.0–80.0)
NEUTROS ABS: 7 10*3/uL — AB (ref 1.5–6.5)
Platelets: 344 10*3/uL (ref 145–400)
RBC: 3.59 10*6/uL — ABNORMAL LOW (ref 4.20–5.70)
RDW: 12.3 % (ref 11.1–15.7)
WBC: 9.6 10*3/uL (ref 4.0–10.0)

## 2016-08-06 NOTE — Progress Notes (Signed)
Hematology and Oncology Follow Up Visit  Rick Walsh [id number] [birth year] 60 y.o. 08/06/2016   Principle Diagnosis:  DVT of the right femoral, popliteal and calf veins  Current Therapy:   Coumadin 5 mg PO daily M, W, F, Sat, Sun and 2.5 mg PO daily T,Th     Interim History:  Rick Walsh is here today with his brother for follow-up. His INR is 3.9.  He has had no issues with bleeding, bruising or petechiae. He verbalized taking his coumadin as listed above.  He still has some swelling around the right ankle and some occasional "tingling" in his right thigh that comes and goes. Pedal pulses are +2. No weakness or numbness in his extremities.   He was playing with some Leggos after Thanksgiving. They fell on the floor. He squatted down for a while. After that, he had some slight swelling and bruising in the right ankle and some slight swelling in the right calf.  He was supposed to have a Doppler of his right leg today. However, he cannot afford this.  He has maintained a good appetite and is staying well hydrated. His weight is stable.   There's been no change in his medications.  Overall, his performance status is ECOG 1.  Medications:    Medication List       Accurate as of 08/06/16  2:57 PM. Always use your most recent med list.          alendronate 70 MG tablet Commonly known as:  FOSAMAX Take 70 mg by mouth once a week. Take with a full glass of water on an empty stomach.   atenolol 25 MG tablet Commonly known as:  TENORMIN Take 25 mg by mouth daily.   cyanocobalamin 1000 MCG/ML injection Commonly known as:  (VITAMIN B-12) Inject 1,000 mcg into the muscle every 30 (thirty) days.   levETIRAcetam 500 MG tablet Commonly known as:  KEPPRA Take 500 mg by mouth 2 (two) times daily.   multivitamin with minerals Tabs tablet Take 1 tablet by mouth daily.   phenytoin 100 MG ER capsule Commonly known as:  DILANTIN Take 200 mg by mouth 2 (two) times daily.     potassium chloride 10 MEQ CR capsule Commonly known as:  MICRO-K Take 10 mEq by mouth daily.   Vitamin D3 5000 units Caps Take 1 capsule by mouth daily.   warfarin 5 MG tablet Commonly known as:  COUMADIN Take 1 tablet (5 mg total) by mouth daily at 6 PM. 5 mg PO daily Fri, Sun, Tues, Thurs and 2.5 mg PO daily Sat, Mon, Wed.       Allergies:  Allergies  Allergen Reactions  . Bee Venom Shortness Of Breath and Swelling  . Phenytoin Sodium Nausea And Vomiting and Rash    MUST HAVE NAME BRAND DILANTIN    Past Medical History, Surgical history, Social history, and Family History were reviewed and updated.  Review of Systems: All other 10 point review of systems is negative.   Physical Exam:  weight is 132 lb (59.9 kg). His oral temperature is 97.4 F (36.3 C). His blood pressure is 139/70 and his pulse is 65. His respiration is 20.   Wt Readings from Last 3 Encounters:  08/06/16 132 lb (59.9 kg)  07/02/16 131 lb 1.9 oz (59.5 kg)  05/29/16 129 lb 8 oz (58.7 kg)    Ocular: Sclerae unicteric, pupils equal, round and reactive to light Ear-nose-throat: Oropharynx clear, dentition fair Lymphatic: No cervical supraclavicular or axillary  adenopathy Lungs no rales or rhonchi, good excursion bilaterally Heart regular rate and rhythm, no murmur appreciated Abd soft, nontender, positive bowel sounds, no liver or spleen tip palpated on exam, no fluid wave MSK no focal spinal tenderness, no joint edema Neuro: non-focal, well-oriented, appropriate affect Breasts: Deferred  Lab Results  Component Value Date   WBC 9.6 08/06/2016   HGB 12.0 (L) 08/06/2016   HCT 35.2 (L) 08/06/2016   MCV 98 08/06/2016   PLT 344 08/06/2016   No results found for: FERRITIN, IRON, TIBC, UIBC, IRONPCTSAT Lab Results  Component Value Date   RBC 3.59 (L) 08/06/2016   No results found for: KPAFRELGTCHN, LAMBDASER, KAPLAMBRATIO No results found for: IGGSERUM, IGA, IGMSERUM No results found for:  Ronnald Ramp, A1GS, Nelida Meuse, SPEI   Chemistry      Component Value Date/Time   NA 138 05/28/2016 1412   K 3.3 (L) 05/28/2016 1412   CL 102 05/28/2016 1412   CO2 30 (H) 05/28/2016 1412   BUN 23 05/28/2016 1412   CREATININE 0.82 05/28/2016 1412      Component Value Date/Time   CALCIUM 8.0 (L) 05/28/2016 1412   ALKPHOS 99 05/28/2016 1412   AST 22 05/28/2016 1412   ALT 20 05/28/2016 1412   BILITOT 0.2 05/28/2016 1412     Impression and Plan: Rick Walsh is a 60 yo white male with 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 He is currently on Coumadin  5 mg PO PO daily M, W, F, Sat, Sun and 2.5 mg PO daily T,Th.  I think his INR is okay. His INR is 3.9. He comes in weekly to have his labs checked.   They cannot afford to have a Doppler done of his right leg. As such, we will just have to monitor this by exams.   When I will see him back myself in 6 weeks.   It sounds like he'll be having back to [place] in February or March for 6 months. It really would be nice to get a Doppler before he goes back so we can send to his doctor.   Volanda Napoleon, MD 12/6/20172:57 PM

## 2016-08-13 ENCOUNTER — Other Ambulatory Visit (HOSPITAL_BASED_OUTPATIENT_CLINIC_OR_DEPARTMENT_OTHER): Payer: Managed Care, Other (non HMO)

## 2016-08-13 ENCOUNTER — Other Ambulatory Visit: Payer: Self-pay | Admitting: Family

## 2016-08-13 DIAGNOSIS — I82401 Acute embolism and thrombosis of unspecified deep veins of right lower extremity: Secondary | ICD-10-CM | POA: Diagnosis not present

## 2016-08-13 DIAGNOSIS — I82411 Acute embolism and thrombosis of right femoral vein: Secondary | ICD-10-CM

## 2016-08-13 LAB — PROTIME-INR (CHCC SATELLITE)
INR: 1.6 — ABNORMAL LOW (ref 2.0–3.5)
Protime: 19.2 Seconds — ABNORMAL HIGH (ref 10.6–13.4)

## 2016-08-13 MED ORDER — WARFARIN SODIUM 5 MG PO TABS: 5.0000 mg | ORAL_TABLET | Freq: Every day | ORAL | 0 refills | Status: DC

## 2016-08-19 ENCOUNTER — Other Ambulatory Visit: Payer: Self-pay | Admitting: Family

## 2016-08-19 ENCOUNTER — Other Ambulatory Visit: Payer: Self-pay | Admitting: *Deleted

## 2016-08-19 DIAGNOSIS — I82411 Acute embolism and thrombosis of right femoral vein: Secondary | ICD-10-CM

## 2016-08-20 ENCOUNTER — Other Ambulatory Visit: Payer: Self-pay | Admitting: Family

## 2016-08-20 ENCOUNTER — Other Ambulatory Visit (HOSPITAL_BASED_OUTPATIENT_CLINIC_OR_DEPARTMENT_OTHER): Payer: Managed Care, Other (non HMO)

## 2016-08-20 DIAGNOSIS — I82411 Acute embolism and thrombosis of right femoral vein: Secondary | ICD-10-CM

## 2016-08-20 LAB — PROTIME-INR (CHCC SATELLITE)
INR: 2 (ref 2.0–3.5)
Protime: 24 Seconds — ABNORMAL HIGH (ref 10.6–13.4)

## 2016-08-20 LAB — CBC WITH DIFFERENTIAL (CANCER CENTER ONLY)
BASO#: 0 10*3/uL (ref 0.0–0.2)
BASO%: 0.3 % (ref 0.0–2.0)
EOS ABS: 0.2 10*3/uL (ref 0.0–0.5)
EOS%: 2.4 % (ref 0.0–7.0)
HEMATOCRIT: 35.3 % — AB (ref 38.7–49.9)
HEMOGLOBIN: 12.1 g/dL — AB (ref 13.0–17.1)
LYMPH#: 1.3 10*3/uL (ref 0.9–3.3)
LYMPH%: 13.8 % — ABNORMAL LOW (ref 14.0–48.0)
MCH: 33.6 pg — ABNORMAL HIGH (ref 28.0–33.4)
MCHC: 34.3 g/dL (ref 32.0–35.9)
MCV: 98 fL (ref 82–98)
MONO#: 0.8 10*3/uL (ref 0.1–0.9)
MONO%: 8.8 % (ref 0.0–13.0)
NEUT%: 74.7 % (ref 40.0–80.0)
NEUTROS ABS: 7 10*3/uL — AB (ref 1.5–6.5)
Platelets: 344 10*3/uL (ref 145–400)
RBC: 3.6 10*6/uL — AB (ref 4.20–5.70)
RDW: 12.4 % (ref 11.1–15.7)
WBC: 9.4 10*3/uL (ref 4.0–10.0)

## 2016-08-20 MED ORDER — WARFARIN SODIUM 5 MG PO TABS: ORAL_TABLET | ORAL | 0 refills | Status: AC

## 2016-08-26 ENCOUNTER — Other Ambulatory Visit: Payer: Self-pay | Admitting: *Deleted

## 2016-08-26 DIAGNOSIS — I82411 Acute embolism and thrombosis of right femoral vein: Secondary | ICD-10-CM

## 2016-08-27 ENCOUNTER — Telehealth: Payer: Self-pay | Admitting: *Deleted

## 2016-08-27 ENCOUNTER — Other Ambulatory Visit (HOSPITAL_BASED_OUTPATIENT_CLINIC_OR_DEPARTMENT_OTHER): Payer: Managed Care, Other (non HMO)

## 2016-08-27 DIAGNOSIS — I82411 Acute embolism and thrombosis of right femoral vein: Secondary | ICD-10-CM | POA: Diagnosis not present

## 2016-08-27 LAB — CBC WITH DIFFERENTIAL (CANCER CENTER ONLY)
BASO#: 0 10*3/uL (ref 0.0–0.2)
BASO%: 0.2 % (ref 0.0–2.0)
EOS%: 1 % (ref 0.0–7.0)
Eosinophils Absolute: 0.1 10*3/uL (ref 0.0–0.5)
HEMATOCRIT: 34.5 % — AB (ref 38.7–49.9)
HGB: 11.7 g/dL — ABNORMAL LOW (ref 13.0–17.1)
LYMPH#: 1.4 10*3/uL (ref 0.9–3.3)
LYMPH%: 11.1 % — ABNORMAL LOW (ref 14.0–48.0)
MCH: 33.5 pg — ABNORMAL HIGH (ref 28.0–33.4)
MCHC: 33.9 g/dL (ref 32.0–35.9)
MCV: 99 fL — ABNORMAL HIGH (ref 82–98)
MONO#: 1.6 10*3/uL — ABNORMAL HIGH (ref 0.1–0.9)
MONO%: 12.7 % (ref 0.0–13.0)
NEUT#: 9.5 10*3/uL — ABNORMAL HIGH (ref 1.5–6.5)
NEUT%: 75 % (ref 40.0–80.0)
Platelets: 366 10*3/uL (ref 145–400)
RBC: 3.49 10*6/uL — ABNORMAL LOW (ref 4.20–5.70)
RDW: 12.5 % (ref 11.1–15.7)
WBC: 12.6 10*3/uL — ABNORMAL HIGH (ref 4.0–10.0)

## 2016-08-27 LAB — PROTIME-INR (CHCC SATELLITE)
INR: 2.3 (ref 2.0–3.5)
Protime: 27.6 Seconds — ABNORMAL HIGH (ref 10.6–13.4)

## 2016-08-27 NOTE — Telephone Encounter (Addendum)
Patient's brother is aware of lab results and instructions  ----- Message from Volanda Napoleon, MD sent at 08/27/2016  2:11 PM EST ----- Call - no change in coumadin

## 2016-09-02 ENCOUNTER — Other Ambulatory Visit: Payer: Self-pay | Admitting: *Deleted

## 2016-09-02 DIAGNOSIS — I82401 Acute embolism and thrombosis of unspecified deep veins of right lower extremity: Secondary | ICD-10-CM

## 2016-09-03 ENCOUNTER — Telehealth: Payer: Self-pay | Admitting: *Deleted

## 2016-09-03 ENCOUNTER — Other Ambulatory Visit (HOSPITAL_BASED_OUTPATIENT_CLINIC_OR_DEPARTMENT_OTHER): Payer: Managed Care, Other (non HMO)

## 2016-09-03 DIAGNOSIS — I82401 Acute embolism and thrombosis of unspecified deep veins of right lower extremity: Secondary | ICD-10-CM | POA: Diagnosis not present

## 2016-09-03 LAB — PROTIME-INR (CHCC SATELLITE)
INR: 3 (ref 2.0–3.5)
PROTIME: 36 s — AB (ref 10.6–13.4)

## 2016-09-03 LAB — COMPREHENSIVE METABOLIC PANEL
ALK PHOS: 87 U/L (ref 40–150)
ALT: 21 U/L (ref 0–55)
ANION GAP: 11 meq/L (ref 3–11)
AST: 24 U/L (ref 5–34)
Albumin: 3.1 g/dL — ABNORMAL LOW (ref 3.5–5.0)
BILIRUBIN TOTAL: 0.29 mg/dL (ref 0.20–1.20)
BUN: 20.1 mg/dL (ref 7.0–26.0)
CALCIUM: 8.1 mg/dL — AB (ref 8.4–10.4)
CHLORIDE: 108 meq/L (ref 98–109)
CO2: 26 meq/L (ref 22–29)
Creatinine: 0.8 mg/dL (ref 0.7–1.3)
EGFR: >90 mL/min/{1.73_m2} (ref >90)
Glucose: 116 mg/dl (ref 70–140)
POTASSIUM: 3.2 meq/L — AB (ref 3.5–5.1)
Sodium: 144 mEq/L (ref 136–145)
Total Protein: 7.2 g/dL (ref 6.4–8.3)

## 2016-09-03 LAB — CBC WITH DIFFERENTIAL (CANCER CENTER ONLY)
BASO#: 0 10*3/uL (ref 0.0–0.2)
BASO%: 0.6 % (ref 0.0–2.0)
EOS ABS: 0.3 10*3/uL (ref 0.0–0.5)
EOS%: 3.7 % (ref 0.0–7.0)
HCT: 32.6 % — ABNORMAL LOW (ref 38.7–49.9)
HGB: 10.8 g/dL — ABNORMAL LOW (ref 13.0–17.1)
LYMPH#: 1.1 10*3/uL (ref 0.9–3.3)
LYMPH%: 16.4 % (ref 14.0–48.0)
MCH: 32.8 pg (ref 28.0–33.4)
MCHC: 33.1 g/dL (ref 32.0–35.9)
MCV: 99 fL — ABNORMAL HIGH (ref 82–98)
MONO#: 0.9 10*3/uL (ref 0.1–0.9)
MONO%: 12.6 % (ref 0.0–13.0)
NEUT%: 66.7 % (ref 40.0–80.0)
NEUTROS ABS: 4.7 10*3/uL (ref 1.5–6.5)
PLATELETS: 342 10*3/uL (ref 145–400)
RBC: 3.29 10*6/uL — AB (ref 4.20–5.70)
RDW: 12.4 % (ref 11.1–15.7)
WBC: 7 10*3/uL (ref 4.0–10.0)

## 2016-09-03 NOTE — Telephone Encounter (Addendum)
Patient's brother aware of results  ----- Message from Volanda Napoleon, MD sent at 09/03/2016  3:27 PM EST ----- Call and let him know that the INR is perfect. Laurey Arrow

## 2016-09-10 ENCOUNTER — Ambulatory Visit (HOSPITAL_BASED_OUTPATIENT_CLINIC_OR_DEPARTMENT_OTHER): Payer: Managed Care, Other (non HMO) | Admitting: Family

## 2016-09-10 ENCOUNTER — Other Ambulatory Visit (HOSPITAL_BASED_OUTPATIENT_CLINIC_OR_DEPARTMENT_OTHER): Payer: Managed Care, Other (non HMO)

## 2016-09-10 VITALS — BP 139/73 | HR 56 | Temp 97.9°F | Resp 18 | Wt 130.4 lb

## 2016-09-10 DIAGNOSIS — I82411 Acute embolism and thrombosis of right femoral vein: Secondary | ICD-10-CM | POA: Diagnosis not present

## 2016-09-10 DIAGNOSIS — I82401 Acute embolism and thrombosis of unspecified deep veins of right lower extremity: Secondary | ICD-10-CM

## 2016-09-10 DIAGNOSIS — Z7901 Long term (current) use of anticoagulants: Secondary | ICD-10-CM

## 2016-09-10 DIAGNOSIS — I82431 Acute embolism and thrombosis of right popliteal vein: Secondary | ICD-10-CM | POA: Diagnosis not present

## 2016-09-10 LAB — CMP (CANCER CENTER ONLY)
ALT(SGPT): 31 U/L (ref 10–47)
AST: 29 U/L (ref 11–38)
Albumin: 2.9 g/dL — ABNORMAL LOW (ref 3.3–5.5)
Alkaline Phosphatase: 79 U/L (ref 26–84)
BUN, Bld: 14 mg/dL (ref 7–22)
CALCIUM: 7.2 mg/dL — AB (ref 8.0–10.3)
CHLORIDE: 109 meq/L — AB (ref 98–108)
CO2: 28 meq/L (ref 18–33)
CREATININE: 0.8 mg/dL (ref 0.6–1.2)
GLUCOSE: 95 mg/dL (ref 73–118)
Potassium: 3.6 mEq/L (ref 3.3–4.7)
SODIUM: 144 meq/L (ref 128–145)
Total Bilirubin: 0.5 mg/dl (ref 0.20–1.60)
Total Protein: 7.3 g/dL (ref 6.4–8.1)

## 2016-09-10 LAB — CBC WITH DIFFERENTIAL (CANCER CENTER ONLY)
BASO#: 0 10*3/uL (ref 0.0–0.2)
BASO%: 0.5 % (ref 0.0–2.0)
EOS ABS: 0.2 10*3/uL (ref 0.0–0.5)
EOS%: 3 % (ref 0.0–7.0)
HCT: 33.7 % — ABNORMAL LOW (ref 38.7–49.9)
HEMOGLOBIN: 11.3 g/dL — AB (ref 13.0–17.1)
LYMPH#: 0.9 10*3/uL (ref 0.9–3.3)
LYMPH%: 15.4 % (ref 14.0–48.0)
MCH: 33.6 pg — AB (ref 28.0–33.4)
MCHC: 33.5 g/dL (ref 32.0–35.9)
MCV: 100 fL — ABNORMAL HIGH (ref 82–98)
MONO#: 0.8 10*3/uL (ref 0.1–0.9)
MONO%: 13.4 % — AB (ref 0.0–13.0)
NEUT%: 67.7 % (ref 40.0–80.0)
NEUTROS ABS: 4.1 10*3/uL (ref 1.5–6.5)
Platelets: 355 10*3/uL (ref 145–400)
RBC: 3.36 10*6/uL — ABNORMAL LOW (ref 4.20–5.70)
RDW: 12.8 % (ref 11.1–15.7)
WBC: 6 10*3/uL (ref 4.0–10.0)

## 2016-09-10 LAB — PROTIME-INR (CHCC SATELLITE)
INR: 3.3 (ref 2.0–3.5)
Protime: 39.6 Seconds — ABNORMAL HIGH (ref 10.6–13.4)

## 2016-09-10 NOTE — Progress Notes (Signed)
Hematology and Oncology Follow Up Visit  Zebulen Gallen [id number] 1956-06-12 61 y.o. 09/10/2016   Principle Diagnosis:  DVT of the right femoral, popliteal and calf veins  Current Therapy:   Coumadin 5 mg PO daily M, W, F, Sat, Sun and 2.5 mg PO daily T,Th     Interim History:  Mr. Wacha is here today with his brother for follow-up. He is doing quite well and has no complaints. His INR is therapeutic at 3.3 today. Hgb is stable at 11.3 with an MCV of 100.  No fever, chills, n/v, cough, rash, dizziness, SOB, chest pain, palpitations, abdominal pain or changes in bowel or bladder habits.  No syncope or seizures. He did trip and fall at home last week but thankfully was not injured. He has had no episodes of bleeding, bruising or petechiae.  No swelling, tenderness, numbness or tingling. No new aches or pains.  We would like for him to have an US of the right lower extremity before he goes to [place] for the summer. They are still experiencing financial problems and can not afford the co-pay. Baxter Flattery will check and see if there is any financial assistance to help lower this amount.   He has maintained a good appetite and is staying well hydrated. His weight is stable.   Medications:  Allergies as of 09/10/2016      Reactions   Bee Venom Shortness Of Breath, Swelling   Phenytoin Sodium Nausea And Vomiting, Rash   MUST HAVE NAME BRAND DILANTIN      Medication List       Accurate as of 09/10/16  3:40 PM. Always use your most recent med list.          alendronate 70 MG tablet Commonly known as:  FOSAMAX Take 70 mg by mouth once a week. Take with a full glass of water on an empty stomach.   atenolol 25 MG tablet Commonly known as:  TENORMIN Take 25 mg by mouth daily.   cyanocobalamin 1000 MCG/ML injection Commonly known as:  (VITAMIN B-12) Inject 1,000 mcg into the muscle every 30 (thirty) days.   levETIRAcetam 500 MG tablet Commonly known as:  KEPPRA Take 500 mg by mouth 2  (two) times daily.   multivitamin with minerals Tabs tablet Take 1 tablet by mouth daily.   phenytoin 100 MG ER capsule Commonly known as:  DILANTIN Take 200 mg by mouth 2 (two) times daily.   potassium chloride 10 MEQ CR capsule Commonly known as:  MICRO-K Take 10 mEq by mouth daily.   Vitamin D3 5000 units Caps Take 1 capsule by mouth daily.   warfarin 5 MG tablet Commonly known as:  COUMADIN TAKE 5 MG BY MOUTH DAILY AT 6 PM ON FRI., SUN., TUE., AND THURS. AND TAKE 2.5 MG DAILY ON SAT., MON., AND WED.       Allergies:  Allergies  Allergen Reactions  . Bee Venom Shortness Of Breath and Swelling  . Phenytoin Sodium Nausea And Vomiting and Rash    MUST HAVE NAME BRAND DILANTIN    Past Medical History, Surgical history, Social history, and Family History were reviewed and updated.  Review of Systems: All other 10 point review of systems is negative.   Physical Exam:  weight is 130 lb 6.4 oz (59.1 kg). His oral temperature is 97.9 F (36.6 C). His blood pressure is 139/73 and his pulse is 56 (abnormal). His respiration is 18 and oxygen saturation is 99%.   Wt Readings from Last  3 Encounters:  09/10/16 130 lb 6.4 oz (59.1 kg)  08/06/16 132 lb (59.9 kg)  07/02/16 131 lb 1.9 oz (59.5 kg)    Ocular: Sclerae unicteric, pupils equal, round and reactive to light Ear-nose-throat: Oropharynx clear, dentition fair Lymphatic: No cervical supraclavicular or axillary adenopathy Lungs no rales or rhonchi, good excursion bilaterally Heart regular rate and rhythm, no murmur appreciated Abd soft, nontender, positive bowel sounds, no liver or spleen tip palpated on exam, no fluid wave MSK no focal spinal tenderness, no joint edema Neuro: non-focal, well-oriented, appropriate affect Breasts: Deferred  Lab Results  Component Value Date   WBC 6.0 09/10/2016   HGB 11.3 (L) 09/10/2016   HCT 33.7 (L) 09/10/2016   MCV 100 (H) 09/10/2016   PLT 355 09/10/2016   No results found for:  FERRITIN, IRON, TIBC, UIBC, IRONPCTSAT Lab Results  Component Value Date   RBC 3.36 (L) 09/10/2016   No results found for: KPAFRELGTCHN, LAMBDASER, KAPLAMBRATIO No results found for: IGGSERUM, IGA, IGMSERUM No results found for: Odetta Pink, SPEI   Chemistry      Component Value Date/Time   NA 144 09/10/2016 1426   NA 144 09/03/2016 1330   K 3.6 09/10/2016 1426   K 3.2 (L) 09/03/2016 1330   CL 109 (H) 09/10/2016 1426   CO2 28 09/10/2016 1426   CO2 26 09/03/2016 1330   BUN 14 09/10/2016 1426   BUN 20.1 09/03/2016 1330   CREATININE 0.8 09/10/2016 1426   CREATININE 0.8 09/03/2016 1330      Component Value Date/Time   CALCIUM 7.2 (L) 09/10/2016 1426   CALCIUM 8.1 (L) 09/03/2016 1330   ALKPHOS 79 09/10/2016 1426   ALKPHOS 87 09/03/2016 1330   AST 29 09/10/2016 1426   AST 24 09/03/2016 1330   ALT 31 09/10/2016 1426   ALT 21 09/03/2016 1330   BILITOT 0.50 09/10/2016 1426   BILITOT 0.29 09/03/2016 1330     Impression and Plan: Mr. 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 He is currently on Coumadin 5 mg PO PO daily M, W, F, Sat, Sun and 2.5 mg PO daily T,Th. His INR is therapeutic at this time so he will stay on his same Coumadin Regimen.  Baxter Flattery is working on getting them financial assistance to help with his [place] so he can have one before leaving for [place].  He will continue to have his labs checked every week and we will plan to see him back in 6 weeks right before he leaves for the summer.  Both he and his family know to contact our office with any questions or concerns. We can certainly see him sooner if need be.   Eliezer Bottom, NP 1/10/20183:40 PM

## 2016-09-17 ENCOUNTER — Other Ambulatory Visit: Payer: Managed Care, Other (non HMO)

## 2016-09-19 ENCOUNTER — Other Ambulatory Visit (HOSPITAL_BASED_OUTPATIENT_CLINIC_OR_DEPARTMENT_OTHER): Payer: Managed Care, Other (non HMO)

## 2016-09-19 DIAGNOSIS — I82411 Acute embolism and thrombosis of right femoral vein: Secondary | ICD-10-CM

## 2016-09-19 LAB — CMP (CANCER CENTER ONLY)
ALBUMIN: 3 g/dL — AB (ref 3.3–5.5)
ALT(SGPT): 22 U/L (ref 10–47)
AST: 29 U/L (ref 11–38)
Alkaline Phosphatase: 81 U/L (ref 26–84)
BUN, Bld: 17 mg/dL (ref 7–22)
CHLORIDE: 101 meq/L (ref 98–108)
CO2: 30 mEq/L (ref 18–33)
Calcium: 7.9 mg/dL — ABNORMAL LOW (ref 8.0–10.3)
Creat: 1.1 mg/dl (ref 0.6–1.2)
Glucose, Bld: 102 mg/dL (ref 73–118)
POTASSIUM: 3.9 meq/L (ref 3.3–4.7)
Sodium: 142 mEq/L (ref 128–145)
TOTAL PROTEIN: 7.1 g/dL (ref 6.4–8.1)
Total Bilirubin: 0.5 mg/dl (ref 0.20–1.60)

## 2016-09-19 LAB — CBC WITH DIFFERENTIAL (CANCER CENTER ONLY)
BASO#: 0 10*3/uL (ref 0.0–0.2)
BASO%: 0.7 % (ref 0.0–2.0)
EOS ABS: 0.2 10*3/uL (ref 0.0–0.5)
EOS%: 4.1 % (ref 0.0–7.0)
HEMATOCRIT: 33.6 % — AB (ref 38.7–49.9)
HEMOGLOBIN: 11.2 g/dL — AB (ref 13.0–17.1)
LYMPH#: 0.9 10*3/uL (ref 0.9–3.3)
LYMPH%: 16.1 % (ref 14.0–48.0)
MCH: 33.6 pg — AB (ref 28.0–33.4)
MCHC: 33.3 g/dL (ref 32.0–35.9)
MCV: 101 fL — AB (ref 82–98)
MONO#: 0.7 10*3/uL (ref 0.1–0.9)
MONO%: 12.4 % (ref 0.0–13.0)
NEUT%: 66.7 % (ref 40.0–80.0)
NEUTROS ABS: 3.9 10*3/uL (ref 1.5–6.5)
Platelets: 333 10*3/uL (ref 145–400)
RBC: 3.33 10*6/uL — ABNORMAL LOW (ref 4.20–5.70)
RDW: 12.9 % (ref 11.1–15.7)
WBC: 5.8 10*3/uL (ref 4.0–10.0)

## 2016-09-19 LAB — PROTIME-INR (CHCC SATELLITE)
INR: 3 (ref 2.0–3.5)
Protime: 36 Seconds — ABNORMAL HIGH (ref 10.6–13.4)

## 2016-09-24 ENCOUNTER — Telehealth: Payer: Self-pay | Admitting: *Deleted

## 2016-09-24 ENCOUNTER — Other Ambulatory Visit (HOSPITAL_BASED_OUTPATIENT_CLINIC_OR_DEPARTMENT_OTHER): Payer: Managed Care, Other (non HMO)

## 2016-09-24 DIAGNOSIS — I82411 Acute embolism and thrombosis of right femoral vein: Secondary | ICD-10-CM

## 2016-09-24 LAB — CBC WITH DIFFERENTIAL (CANCER CENTER ONLY)
BASO#: 0 10*3/uL (ref 0.0–0.2)
BASO%: 0.5 % (ref 0.0–2.0)
EOS%: 3.5 % (ref 0.0–7.0)
Eosinophils Absolute: 0.2 10*3/uL (ref 0.0–0.5)
HCT: 35.2 % — ABNORMAL LOW (ref 38.7–49.9)
HGB: 11.7 g/dL — ABNORMAL LOW (ref 13.0–17.1)
LYMPH#: 0.8 10*3/uL — ABNORMAL LOW (ref 0.9–3.3)
LYMPH%: 13.7 % — AB (ref 14.0–48.0)
MCH: 33.7 pg — ABNORMAL HIGH (ref 28.0–33.4)
MCHC: 33.2 g/dL (ref 32.0–35.9)
MCV: 101 fL — ABNORMAL HIGH (ref 82–98)
MONO#: 0.6 10*3/uL (ref 0.1–0.9)
MONO%: 11.1 % (ref 0.0–13.0)
NEUT#: 4.1 10*3/uL (ref 1.5–6.5)
NEUT%: 71.2 % (ref 40.0–80.0)
PLATELETS: 328 10*3/uL (ref 145–400)
RBC: 3.47 10*6/uL — AB (ref 4.20–5.70)
RDW: 12.7 % (ref 11.1–15.7)
WBC: 5.8 10*3/uL (ref 4.0–10.0)

## 2016-09-24 LAB — COMPREHENSIVE METABOLIC PANEL
ALT: 18 U/L (ref 0–55)
AST: 23 U/L (ref 5–34)
Albumin: 3.2 g/dL — ABNORMAL LOW (ref 3.5–5.0)
Alkaline Phosphatase: 87 U/L (ref 40–150)
Anion Gap: 9 mEq/L (ref 3–11)
BUN: 19 mg/dL (ref 7.0–26.0)
CHLORIDE: 105 meq/L (ref 98–109)
CO2: 31 meq/L — AB (ref 22–29)
CREATININE: 0.9 mg/dL (ref 0.7–1.3)
Calcium: 8.4 mg/dL (ref 8.4–10.4)
GLUCOSE: 95 mg/dL (ref 70–140)
Potassium: 4.2 mEq/L (ref 3.5–5.1)
SODIUM: 145 meq/L (ref 136–145)
TOTAL PROTEIN: 7.3 g/dL (ref 6.4–8.3)
Total Bilirubin: 0.25 mg/dL (ref 0.20–1.20)

## 2016-09-24 LAB — PROTIME-INR (CHCC SATELLITE)
INR: 1.8 — ABNORMAL LOW (ref 2.0–3.5)
Protime: 21.6 Seconds — ABNORMAL HIGH (ref 10.6–13.4)

## 2016-09-24 NOTE — Telephone Encounter (Addendum)
Patient's brother aware of results  ----- Message from Eliezer Bottom, NP sent at 09/24/2016  3:57 PM EST ----- Regarding: coumadin INR ok, no change in Coumadin. Thank you!   Sarah  ----- Message ----- From: Interface, Lab In Three Zero One Sent: 09/24/2016   1:30 PM To: Eliezer Bottom, NP

## 2016-10-01 ENCOUNTER — Other Ambulatory Visit (HOSPITAL_BASED_OUTPATIENT_CLINIC_OR_DEPARTMENT_OTHER): Payer: Managed Care, Other (non HMO)

## 2016-10-01 DIAGNOSIS — I82411 Acute embolism and thrombosis of right femoral vein: Secondary | ICD-10-CM | POA: Diagnosis not present

## 2016-10-01 LAB — CBC WITH DIFFERENTIAL (CANCER CENTER ONLY)
BASO#: 0 10*3/uL (ref 0.0–0.2)
BASO%: 0.5 % (ref 0.0–2.0)
EOS%: 2.3 % (ref 0.0–7.0)
Eosinophils Absolute: 0.2 10*3/uL (ref 0.0–0.5)
HCT: 34.7 % — ABNORMAL LOW (ref 38.7–49.9)
HGB: 11.7 g/dL — ABNORMAL LOW (ref 13.0–17.1)
LYMPH#: 0.8 10*3/uL — ABNORMAL LOW (ref 0.9–3.3)
LYMPH%: 10.3 % — AB (ref 14.0–48.0)
MCH: 34.1 pg — ABNORMAL HIGH (ref 28.0–33.4)
MCHC: 33.7 g/dL (ref 32.0–35.9)
MCV: 101 fL — AB (ref 82–98)
MONO#: 0.7 10*3/uL (ref 0.1–0.9)
MONO%: 8.7 % (ref 0.0–13.0)
NEUT#: 6.2 10*3/uL (ref 1.5–6.5)
NEUT%: 78.2 % (ref 40.0–80.0)
Platelets: 316 10*3/uL (ref 145–400)
RBC: 3.43 10*6/uL — ABNORMAL LOW (ref 4.20–5.70)
RDW: 12.1 % (ref 11.1–15.7)
WBC: 7.9 10*3/uL (ref 4.0–10.0)

## 2016-10-01 LAB — COMPREHENSIVE METABOLIC PANEL
ALT: 19 U/L (ref 0–55)
ANION GAP: 8 meq/L (ref 3–11)
AST: 23 U/L (ref 5–34)
Albumin: 3.2 g/dL — ABNORMAL LOW (ref 3.5–5.0)
Alkaline Phosphatase: 86 U/L (ref 40–150)
BUN: 21.4 mg/dL (ref 7.0–26.0)
CO2: 30 meq/L — AB (ref 22–29)
CREATININE: 0.9 mg/dL (ref 0.7–1.3)
Calcium: 8.3 mg/dL — ABNORMAL LOW (ref 8.4–10.4)
Chloride: 105 mEq/L (ref 98–109)
EGFR: >90 mL/min/{1.73_m2} (ref >90)
Glucose: 110 mg/dl (ref 70–140)
POTASSIUM: 4.1 meq/L (ref 3.5–5.1)
Sodium: 143 mEq/L (ref 136–145)
Total Bilirubin: 0.22 mg/dL (ref 0.20–1.20)
Total Protein: 7.1 g/dL (ref 6.4–8.3)

## 2016-10-01 LAB — PROTIME-INR (CHCC SATELLITE)
INR: 2.8 (ref 2.0–3.5)
Protime: 33.6 Seconds — ABNORMAL HIGH (ref 10.6–13.4)

## 2016-10-08 ENCOUNTER — Other Ambulatory Visit: Payer: Self-pay | Admitting: Hematology & Oncology

## 2016-10-08 ENCOUNTER — Other Ambulatory Visit (HOSPITAL_BASED_OUTPATIENT_CLINIC_OR_DEPARTMENT_OTHER): Payer: Managed Care, Other (non HMO)

## 2016-10-08 DIAGNOSIS — I82411 Acute embolism and thrombosis of right femoral vein: Secondary | ICD-10-CM | POA: Diagnosis not present

## 2016-10-08 DIAGNOSIS — I825Y2 Chronic embolism and thrombosis of unspecified deep veins of left proximal lower extremity: Secondary | ICD-10-CM

## 2016-10-08 LAB — COMPREHENSIVE METABOLIC PANEL (CC13)
ALBUMIN: 3.5 g/dL (ref 3.5–5.5)
ALT: 20 IU/L (ref 0–44)
AST (SGOT): 22 IU/L (ref 0–40)
Albumin/Globulin Ratio: 1 — ABNORMAL LOW (ref 1.2–2.2)
Alkaline Phosphatase, S: 84 IU/L (ref 39–117)
BUN / CREAT RATIO: 24 — AB (ref 9–20)
BUN: 22 mg/dL (ref 6–24)
CALCIUM: 7.9 mg/dL — AB (ref 8.7–10.2)
CO2: 26 mmol/L (ref 18–29)
CREATININE: 0.9 mg/dL (ref 0.76–1.27)
Chloride, Ser: 103 mmol/L (ref 96–106)
GFR, EST AFRICAN AMERICAN: 108 mL/min/{1.73_m2} (ref >59)
GFR, EST NON AFRICAN AMERICAN: 93 mL/min/{1.73_m2} (ref >59)
GLUCOSE: 104 mg/dL — AB (ref 65–99)
Globulin, Total: 3.6 g/dL (ref 1.5–4.5)
Potassium, Ser: 3.6 mmol/L (ref 3.5–5.2)
Sodium: 138 mmol/L (ref 134–144)
TOTAL PROTEIN: 7.1 g/dL (ref 6.0–8.5)

## 2016-10-08 LAB — CBC WITH DIFFERENTIAL (CANCER CENTER ONLY)
BASO#: 0 10*3/uL (ref 0.0–0.2)
BASO%: 0.4 % (ref 0.0–2.0)
EOS ABS: 0.2 10*3/uL (ref 0.0–0.5)
EOS%: 2.9 % (ref 0.0–7.0)
HEMATOCRIT: 34.8 % — AB (ref 38.7–49.9)
HEMOGLOBIN: 11.7 g/dL — AB (ref 13.0–17.1)
LYMPH#: 1.1 10*3/uL (ref 0.9–3.3)
LYMPH%: 14.3 % (ref 14.0–48.0)
MCH: 33.6 pg — AB (ref 28.0–33.4)
MCHC: 33.6 g/dL (ref 32.0–35.9)
MCV: 100 fL — AB (ref 82–98)
MONO#: 0.8 10*3/uL (ref 0.1–0.9)
MONO%: 10.3 % (ref 0.0–13.0)
NEUT%: 72.1 % (ref 40.0–80.0)
NEUTROS ABS: 5.5 10*3/uL (ref 1.5–6.5)
Platelets: 325 10*3/uL (ref 145–400)
RBC: 3.48 10*6/uL — ABNORMAL LOW (ref 4.20–5.70)
RDW: 11.9 % (ref 11.1–15.7)
WBC: 7.7 10*3/uL (ref 4.0–10.0)

## 2016-10-08 LAB — PROTIME-INR (CHCC SATELLITE)

## 2016-10-08 LAB — PROTHROMBIN TIME (PT)
INR: 6.7 (ref 0.8–1.2)
Prothrombin Time: 65.9 s — ABNORMAL HIGH (ref 9.1–12.0)

## 2016-10-10 ENCOUNTER — Ambulatory Visit (HOSPITAL_BASED_OUTPATIENT_CLINIC_OR_DEPARTMENT_OTHER): Payer: Managed Care, Other (non HMO)

## 2016-10-10 DIAGNOSIS — I825Y2 Chronic embolism and thrombosis of unspecified deep veins of left proximal lower extremity: Secondary | ICD-10-CM

## 2016-10-10 LAB — PROTIME-INR (CHCC SATELLITE)
INR: 1.9 — AB (ref 2.0–3.5)
Protime: 22.8 Seconds — ABNORMAL HIGH (ref 10.6–13.4)

## 2016-10-15 ENCOUNTER — Other Ambulatory Visit (HOSPITAL_BASED_OUTPATIENT_CLINIC_OR_DEPARTMENT_OTHER): Payer: Managed Care, Other (non HMO)

## 2016-10-15 DIAGNOSIS — I82411 Acute embolism and thrombosis of right femoral vein: Secondary | ICD-10-CM

## 2016-10-15 LAB — COMPREHENSIVE METABOLIC PANEL (CC13)
A/G RATIO: 1 — AB (ref 1.2–2.2)
ALT: 20 IU/L (ref 0–44)
AST (SGOT): 23 IU/L (ref 0–40)
Albumin, Serum: 3.5 g/dL (ref 3.5–5.5)
Alkaline Phosphatase, S: 88 IU/L (ref 39–117)
BUN/Creatinine Ratio: 27 — ABNORMAL HIGH (ref 9–20)
BUN: 22 mg/dL (ref 6–24)
Bilirubin Total: 0.2 mg/dL (ref 0.0–1.2)
CALCIUM: 7.7 mg/dL — AB (ref 8.7–10.2)
CO2: 31 mmol/L — AB (ref 18–29)
CREATININE: 0.81 mg/dL (ref 0.76–1.27)
Chloride, Ser: 103 mmol/L (ref 96–106)
GFR, EST AFRICAN AMERICAN: 112 mL/min/{1.73_m2} (ref >59)
GFR, EST NON AFRICAN AMERICAN: 97 mL/min/{1.73_m2} (ref >59)
Globulin, Total: 3.5 g/dL (ref 1.5–4.5)
Glucose: 99 mg/dL (ref 65–99)
POTASSIUM: 3.2 mmol/L — AB (ref 3.5–5.2)
Sodium: 134 mmol/L (ref 134–144)
TOTAL PROTEIN: 7 g/dL (ref 6.0–8.5)

## 2016-10-15 LAB — CBC WITH DIFFERENTIAL (CANCER CENTER ONLY)
BASO#: 0 10*3/uL (ref 0.0–0.2)
BASO%: 0.5 % (ref 0.0–2.0)
EOS%: 2 % (ref 0.0–7.0)
Eosinophils Absolute: 0.2 10*3/uL (ref 0.0–0.5)
HEMATOCRIT: 34.4 % — AB (ref 38.7–49.9)
HGB: 11.7 g/dL — ABNORMAL LOW (ref 13.0–17.1)
LYMPH#: 1 10*3/uL (ref 0.9–3.3)
LYMPH%: 12.2 % — ABNORMAL LOW (ref 14.0–48.0)
MCH: 33.7 pg — ABNORMAL HIGH (ref 28.0–33.4)
MCHC: 34 g/dL (ref 32.0–35.9)
MCV: 99 fL — AB (ref 82–98)
MONO#: 0.8 10*3/uL (ref 0.1–0.9)
MONO%: 9.5 % (ref 0.0–13.0)
NEUT#: 6 10*3/uL (ref 1.5–6.5)
NEUT%: 75.8 % (ref 40.0–80.0)
Platelets: 301 10*3/uL (ref 145–400)
RBC: 3.47 10*6/uL — ABNORMAL LOW (ref 4.20–5.70)
RDW: 12.1 % (ref 11.1–15.7)
WBC: 7.9 10*3/uL (ref 4.0–10.0)

## 2016-10-15 LAB — PROTIME-INR (CHCC SATELLITE)
INR: 2.7 (ref 2.0–3.5)
Protime: 32.4 Seconds — ABNORMAL HIGH (ref 10.6–13.4)

## 2016-10-22 ENCOUNTER — Ambulatory Visit (HOSPITAL_BASED_OUTPATIENT_CLINIC_OR_DEPARTMENT_OTHER): Payer: Managed Care, Other (non HMO) | Admitting: Family

## 2016-10-22 ENCOUNTER — Other Ambulatory Visit (HOSPITAL_BASED_OUTPATIENT_CLINIC_OR_DEPARTMENT_OTHER): Payer: Managed Care, Other (non HMO)

## 2016-10-22 VITALS — BP 124/65 | HR 54 | Temp 98.2°F | Resp 20 | Wt 128.1 lb

## 2016-10-22 DIAGNOSIS — I82411 Acute embolism and thrombosis of right femoral vein: Secondary | ICD-10-CM

## 2016-10-22 DIAGNOSIS — Z7901 Long term (current) use of anticoagulants: Secondary | ICD-10-CM

## 2016-10-22 LAB — COMPREHENSIVE METABOLIC PANEL (CC13)
A/G RATIO: 1.1 — AB (ref 1.2–2.2)
ALT: 18 IU/L (ref 0–44)
AST: 20 IU/L (ref 0–40)
Albumin, Serum: 3.5 g/dL (ref 3.5–5.5)
Alkaline Phosphatase, S: 81 IU/L (ref 39–117)
BILIRUBIN TOTAL: 0.2 mg/dL (ref 0.0–1.2)
BUN/Creatinine Ratio: 24 — ABNORMAL HIGH (ref 9–20)
BUN: 21 mg/dL (ref 6–24)
CHLORIDE: 103 mmol/L (ref 96–106)
Calcium, Ser: 7.5 mg/dL — ABNORMAL LOW (ref 8.7–10.2)
Carbon Dioxide, Total: 31 mmol/L — ABNORMAL HIGH (ref 18–29)
Creatinine, Ser: 0.88 mg/dL (ref 0.76–1.27)
GFR, EST AFRICAN AMERICAN: 109 (ref >59)
GFR, EST NON AFRICAN AMERICAN: 94 (ref >59)
GLOBULIN, TOTAL: 3.1 (ref 1.5–4.5)
Glucose: 84 mg/dL (ref 65–99)
POTASSIUM: 3.3 mmol/L — AB (ref 3.5–5.2)
SODIUM: 138 mmol/L (ref 134–144)
Total Protein: 6.6 g/dL (ref 6.0–8.5)

## 2016-10-22 LAB — CBC WITH DIFFERENTIAL (CANCER CENTER ONLY)
BASO#: 0 10*3/uL (ref 0.0–0.2)
BASO%: 0.7 % (ref 0.0–2.0)
EOS%: 4.5 % (ref 0.0–7.0)
Eosinophils Absolute: 0.2 10*3/uL (ref 0.0–0.5)
HCT: 32.4 % — ABNORMAL LOW (ref 38.7–49.9)
HGB: 11 g/dL — ABNORMAL LOW (ref 13.0–17.1)
LYMPH#: 1 10*3/uL (ref 0.9–3.3)
LYMPH%: 21.9 % (ref 14.0–48.0)
MCH: 33.7 pg — AB (ref 28.0–33.4)
MCHC: 34 g/dL (ref 32.0–35.9)
MCV: 99 fL — AB (ref 82–98)
MONO#: 0.7 10*3/uL (ref 0.1–0.9)
MONO%: 15.9 % — ABNORMAL HIGH (ref 0.0–13.0)
NEUT#: 2.6 10*3/uL (ref 1.5–6.5)
NEUT%: 57 % (ref 40.0–80.0)
PLATELETS: 295 10*3/uL (ref 145–400)
RBC: 3.26 10*6/uL — ABNORMAL LOW (ref 4.20–5.70)
RDW: 12 % (ref 11.1–15.7)
WBC: 4.5 10*3/uL (ref 4.0–10.0)

## 2016-10-22 LAB — PROTIME-INR (CHCC SATELLITE)
INR: 4.1 — AB (ref 2.0–3.5)
Protime: 49.2 Seconds — ABNORMAL HIGH (ref 10.6–13.4)

## 2016-10-22 NOTE — Progress Notes (Signed)
Hematology and Oncology Follow Up Visit  Rick Walsh:[id number] 28-Mar-1956 61 y.o. 10/22/2016   Principle Diagnosis:  DVT of the right femoral, popliteal and calf veins  Current Therapy:   Coumadin 2.5 mg PO daily M, W, F, Sat, Sun and 5 mg PO daily T,Th     Interim History:  Rick Walsh is here today with his brother for follow-up. He is doing well and has no complaints at this time. His INR is 4.1. He has had no problem with bleeding, bruising or petechiae. He verbalized that he is taking his Coumadin as prescribed and listed above.  He still has not had a repeat US of the right lower extremity. He will meet with Baxter Flattery before leaving to arrange this before heading to [place] next month.  He has had no issue with frequent infections. No fever, chills, n/v, cough, rash, dizziness, SOB, chest pain, palpitations, abdominal pain or changes in bowel or bladder habits.  No syncope or seizures. No swelling, tenderness, numbness or tingling. No new aches or pains.  He has maintained a good appetite and is staying well hydrated. His weight is stable.   Medications:  Allergies as of 10/22/2016      Reactions   Bee Venom Shortness Of Breath, Swelling   Phenytoin Sodium Nausea And Vomiting, Rash   MUST HAVE NAME BRAND DILANTIN      Medication List       Accurate as of 10/22/16  1:52 PM. Always use your most recent med list.          alendronate 70 MG tablet Commonly known as:  FOSAMAX Take 70 mg by mouth once a week. Take with a full glass of water on an empty stomach.   atenolol 25 MG tablet Commonly known as:  TENORMIN Take 25 mg by mouth daily.   cyanocobalamin 1000 MCG/ML injection Commonly known as:  (VITAMIN B-12) Inject 1,000 mcg into the muscle every 30 (thirty) days.   levETIRAcetam 500 MG tablet Commonly known as:  KEPPRA Take 500 mg by mouth 2 (two) times daily.   multivitamin with minerals Tabs tablet Take 1 tablet by mouth daily.   phenytoin 100 MG ER  capsule Commonly known as:  DILANTIN Take 200 mg by mouth 2 (two) times daily.   potassium chloride 10 MEQ CR capsule Commonly known as:  MICRO-K Take 10 mEq by mouth daily.   Vitamin D3 5000 units Caps Take 1 capsule by mouth daily.   warfarin 5 MG tablet Commonly known as:  COUMADIN TAKE 5 MG BY MOUTH DAILY AT 6 PM ON FRI., SUN., TUE., AND THURS. AND TAKE 2.5 MG DAILY ON SAT., MON., AND WED.       Allergies:  Allergies  Allergen Reactions  . Bee Venom Shortness Of Breath and Swelling  . Phenytoin Sodium Nausea And Vomiting and Rash    MUST HAVE NAME BRAND DILANTIN    Past Medical History, Surgical history, Social history, and Family History were reviewed and updated.  Review of Systems: All other 10 point review of systems is negative.   Physical Exam:  weight is 128 lb 1.9 oz (58.1 kg). His oral temperature is 98.2 F (36.8 C). His blood pressure is 124/65 and his pulse is 54 (abnormal). His respiration is 20.   Wt Readings from Last 3 Encounters:  10/22/16 128 lb 1.9 oz (58.1 kg)  09/10/16 130 lb 6.4 oz (59.1 kg)  08/06/16 132 lb (59.9 kg)    Ocular: Sclerae unicteric, pupils equal,  round and reactive to light Ear-nose-throat: Oropharynx clear, dentition fair Lymphatic: No cervical supraclavicular or axillary adenopathy Lungs no rales or rhonchi, good excursion bilaterally Heart regular rate and rhythm, no murmur appreciated Abd soft, nontender, positive bowel sounds, no liver or spleen tip palpated on exam, no fluid wave MSK no focal spinal tenderness, no joint edema Neuro: non-focal, well-oriented, appropriate affect Breasts: Deferred  Lab Results  Component Value Date   WBC 7.9 10/15/2016   HGB 11.7 (L) 10/15/2016   HCT 34.4 (L) 10/15/2016   MCV 99 (H) 10/15/2016   PLT 301 10/15/2016   No results found for: FERRITIN, IRON, TIBC, UIBC, IRONPCTSAT Lab Results  Component Value Date   RBC 3.47 (L) 10/15/2016   No results found for: KPAFRELGTCHN,  LAMBDASER, KAPLAMBRATIO No results found for: IGGSERUM, IGA, IGMSERUM No results found for: Odetta Pink, SPEI   Chemistry      Component Value Date/Time   NA 134 10/15/2016 1333   NA 143 10/01/2016 1329   K 3.2 (L) 10/15/2016 1333   K 4.1 10/01/2016 1329   CL 103 10/15/2016 1333   CL 101 09/19/2016 1424   CO2 31 (H) 10/15/2016 1333   CO2 30 (H) 10/01/2016 1329   BUN 22 10/15/2016 1333   BUN 21.4 10/01/2016 1329   CREATININE 0.81 10/15/2016 1333   CREATININE 0.9 10/01/2016 1329      Component Value Date/Time   CALCIUM 7.7 (L) 10/15/2016 1333   CALCIUM 8.3 (L) 10/01/2016 1329   ALKPHOS 88 10/15/2016 1333   ALKPHOS 86 10/01/2016 1329   AST 23 10/15/2016 1333   AST 23 10/01/2016 1329   ALT 20 10/15/2016 1333   ALT 19 10/01/2016 1329   BILITOT 0.2 10/15/2016 1333   BILITOT 0.22 10/01/2016 1329     Impression and Plan: Rick Walsh is a 61 yo white male with 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 He is currently on Coumadin 5 mg PO PO daily M, W, F, Sat, Sun and 2.5 mg PO daily T,Th. His INR is 4.1 today. We will have him take 2.5 mg tomorrow instead of 5 mg and then resume 5 mg on Tuesday and Thursday next week.  We will continue to do weekly labs and schedule his right lower extremity US for in the next week or two.  We will see him back in 1 month for labs and follow-up right before he goes back to [place] for the summer.  Both he and his family know to contact our office with any questions or concerns. We can certainly see him sooner if need be.   Eliezer Bottom, NP 2/21/20181:52 PM

## 2016-10-23 ENCOUNTER — Ambulatory Visit: Payer: Medicare (Managed Care) | Admitting: Hematology & Oncology

## 2016-10-23 ENCOUNTER — Other Ambulatory Visit: Payer: Medicare (Managed Care)

## 2016-10-29 ENCOUNTER — Ambulatory Visit (HOSPITAL_BASED_OUTPATIENT_CLINIC_OR_DEPARTMENT_OTHER): Payer: Managed Care, Other (non HMO)

## 2016-10-29 DIAGNOSIS — I82411 Acute embolism and thrombosis of right femoral vein: Secondary | ICD-10-CM | POA: Diagnosis not present

## 2016-10-29 LAB — COMPREHENSIVE METABOLIC PANEL
ALT: 20 U/L (ref 0–55)
AST: 23 U/L (ref 5–34)
Albumin: 3.2 g/dL — ABNORMAL LOW (ref 3.5–5.0)
Alkaline Phosphatase: 87 U/L (ref 40–150)
Anion Gap: 10 mEq/L (ref 3–11)
BUN: 24.3 mg/dL (ref 7.0–26.0)
CALCIUM: 7.6 mg/dL — AB (ref 8.4–10.4)
CHLORIDE: 104 meq/L (ref 98–109)
CO2: 33 meq/L — AB (ref 22–29)
Creatinine: 0.8 mg/dL (ref 0.7–1.3)
EGFR: >90 mL/min/{1.73_m2} (ref >90)
Glucose: 65 mg/dl — ABNORMAL LOW (ref 70–140)
POTASSIUM: 3.1 meq/L — AB (ref 3.5–5.1)
SODIUM: 147 meq/L — AB (ref 136–145)
Total Bilirubin: 0.3 mg/dL (ref 0.20–1.20)
Total Protein: 7.5 g/dL (ref 6.4–8.3)

## 2016-10-29 LAB — CBC WITH DIFFERENTIAL (CANCER CENTER ONLY)
BASO#: 0 10*3/uL (ref 0.0–0.2)
BASO%: 0.5 % (ref 0.0–2.0)
EOS%: 2.9 % (ref 0.0–7.0)
Eosinophils Absolute: 0.2 10*3/uL (ref 0.0–0.5)
HCT: 35.9 % — ABNORMAL LOW (ref 38.7–49.9)
HGB: 12.3 g/dL — ABNORMAL LOW (ref 13.0–17.1)
LYMPH#: 0.9 10*3/uL (ref 0.9–3.3)
LYMPH%: 14.4 % (ref 14.0–48.0)
MCH: 33.9 pg — ABNORMAL HIGH (ref 28.0–33.4)
MCHC: 34.3 g/dL (ref 32.0–35.9)
MCV: 99 fL — ABNORMAL HIGH (ref 82–98)
MONO#: 0.7 10*3/uL (ref 0.1–0.9)
MONO%: 12.4 % (ref 0.0–13.0)
NEUT#: 4.1 10*3/uL (ref 1.5–6.5)
NEUT%: 69.8 % (ref 40.0–80.0)
PLATELETS: 307 10*3/uL (ref 145–400)
RBC: 3.63 10*6/uL — ABNORMAL LOW (ref 4.20–5.70)
RDW: 11.9 % (ref 11.1–15.7)
WBC: 5.9 10*3/uL (ref 4.0–10.0)

## 2016-10-29 LAB — PROTIME-INR (CHCC SATELLITE)
INR: 2.2 (ref 2.0–3.5)
PROTIME: 26.4 s — AB (ref 10.6–13.4)

## 2016-10-30 ENCOUNTER — Telehealth: Payer: Self-pay | Admitting: *Deleted

## 2016-10-30 NOTE — Telephone Encounter (Addendum)
Patients brother aware of results  ----- Message from Eliezer Bottom, NP sent at 10/30/2016  9:02 AM EST ----- Regarding: INR INR looks good. No changes at this time.   Sarah  ----- Message ----- From: Interface, Lab In Three Zero One Sent: 10/29/2016  10:50 AM To: Eliezer Bottom, NP

## 2016-11-05 ENCOUNTER — Other Ambulatory Visit (HOSPITAL_BASED_OUTPATIENT_CLINIC_OR_DEPARTMENT_OTHER): Payer: Managed Care, Other (non HMO)

## 2016-11-05 DIAGNOSIS — I82411 Acute embolism and thrombosis of right femoral vein: Secondary | ICD-10-CM

## 2016-11-05 LAB — COMPREHENSIVE METABOLIC PANEL
ALBUMIN: 3 g/dL — AB (ref 3.5–5.0)
ALK PHOS: 70 U/L (ref 40–150)
ALT: 20 U/L (ref 0–55)
ANION GAP: 9 meq/L (ref 3–11)
AST: 25 U/L (ref 5–34)
BILIRUBIN TOTAL: 0.29 mg/dL (ref 0.20–1.20)
BUN: 20.9 mg/dL (ref 7.0–26.0)
CALCIUM: 7.4 mg/dL — AB (ref 8.4–10.4)
CO2: 30 meq/L — AB (ref 22–29)
Chloride: 107 mEq/L (ref 98–109)
Creatinine: 0.8 mg/dL (ref 0.7–1.3)
Glucose: 78 mg/dl (ref 70–140)
Potassium: 3.3 mEq/L — ABNORMAL LOW (ref 3.5–5.1)
Sodium: 146 mEq/L — ABNORMAL HIGH (ref 136–145)
TOTAL PROTEIN: 6.9 g/dL (ref 6.4–8.3)

## 2016-11-05 LAB — CBC WITH DIFFERENTIAL (CANCER CENTER ONLY)
BASO#: 0 10*3/uL (ref 0.0–0.2)
BASO%: 0.7 % (ref 0.0–2.0)
EOS ABS: 0.2 10*3/uL (ref 0.0–0.5)
EOS%: 4.4 % (ref 0.0–7.0)
HEMATOCRIT: 33.1 % — AB (ref 38.7–49.9)
HGB: 11.5 g/dL — ABNORMAL LOW (ref 13.0–17.1)
LYMPH#: 0.6 10*3/uL — ABNORMAL LOW (ref 0.9–3.3)
LYMPH%: 11.7 % — AB (ref 14.0–48.0)
MCH: 34.2 pg — AB (ref 28.0–33.4)
MCHC: 34.7 g/dL (ref 32.0–35.9)
MCV: 99 fL — AB (ref 82–98)
MONO#: 0.6 10*3/uL (ref 0.1–0.9)
MONO%: 10.4 % (ref 0.0–13.0)
NEUT#: 4 10*3/uL (ref 1.5–6.5)
NEUT%: 72.8 % (ref 40.0–80.0)
PLATELETS: 296 10*3/uL (ref 145–400)
RBC: 3.36 10*6/uL — AB (ref 4.20–5.70)
RDW: 12 % (ref 11.1–15.7)
WBC: 5.5 10*3/uL (ref 4.0–10.0)

## 2016-11-05 LAB — PROTIME-INR (CHCC SATELLITE)

## 2016-11-05 LAB — PROTHROMBIN TIME (PT)
INR: 5.5 — ABNORMAL HIGH (ref 0.8–1.2)
PROTHROMBIN TIME: 55.2 s — AB (ref 9.1–12.0)

## 2016-11-07 ENCOUNTER — Other Ambulatory Visit (HOSPITAL_BASED_OUTPATIENT_CLINIC_OR_DEPARTMENT_OTHER): Payer: Managed Care, Other (non HMO)

## 2016-11-07 ENCOUNTER — Telehealth: Payer: Self-pay | Admitting: *Deleted

## 2016-11-07 DIAGNOSIS — I82411 Acute embolism and thrombosis of right femoral vein: Secondary | ICD-10-CM

## 2016-11-07 LAB — PROTIME-INR (CHCC SATELLITE)
INR: 1.7 — ABNORMAL LOW (ref 2.0–3.5)
Protime: 20.4 Seconds — ABNORMAL HIGH (ref 10.6–13.4)

## 2016-11-07 NOTE — Telephone Encounter (Addendum)
Patient's brother aware of results  ----- Message from Eliezer Bottom, NP sent at 11/07/2016  3:08 PM EST ----- Regarding: Coumadin Ok to restart coumadin at previous dose per Dr. Marin Olp. Thank you!  Sarah  ----- Message ----- From: Interface, Lab In Three Zero One Sent: 11/07/2016   2:04 PM To: Eliezer Bottom, NP

## 2016-11-12 ENCOUNTER — Other Ambulatory Visit: Payer: Self-pay | Admitting: *Deleted

## 2016-11-12 DIAGNOSIS — I82411 Acute embolism and thrombosis of right femoral vein: Secondary | ICD-10-CM

## 2016-11-13 ENCOUNTER — Other Ambulatory Visit (HOSPITAL_BASED_OUTPATIENT_CLINIC_OR_DEPARTMENT_OTHER): Payer: Managed Care, Other (non HMO)

## 2016-11-13 DIAGNOSIS — I82411 Acute embolism and thrombosis of right femoral vein: Secondary | ICD-10-CM | POA: Diagnosis not present

## 2016-11-13 LAB — PROTIME-INR (CHCC SATELLITE)
INR: 2.6 (ref 2.0–3.5)
Protime: 31.2 Seconds — ABNORMAL HIGH (ref 10.6–13.4)

## 2016-11-14 ENCOUNTER — Telehealth: Payer: Self-pay | Admitting: *Deleted

## 2016-11-14 NOTE — Telephone Encounter (Addendum)
Patient aware of results  ----- Message from Volanda Napoleon, MD sent at 11/13/2016  8:34 PM EDT ----- Call his brother -- INR is perfect.  No change in coumadin dose. pete

## 2016-11-18 ENCOUNTER — Other Ambulatory Visit: Payer: Self-pay | Admitting: *Deleted

## 2016-11-18 DIAGNOSIS — I82411 Acute embolism and thrombosis of right femoral vein: Secondary | ICD-10-CM

## 2016-11-19 ENCOUNTER — Other Ambulatory Visit (HOSPITAL_BASED_OUTPATIENT_CLINIC_OR_DEPARTMENT_OTHER): Payer: Managed Care, Other (non HMO)

## 2016-11-19 ENCOUNTER — Ambulatory Visit (HOSPITAL_BASED_OUTPATIENT_CLINIC_OR_DEPARTMENT_OTHER)
Admission: RE | Admit: 2016-11-19 | Discharge: 2016-11-19 | Disposition: A | Discharge status: Home / Self Care | Payer: Medicare (Managed Care) | Source: Ambulatory Visit | Attending: Family | Admitting: Family

## 2016-11-19 ENCOUNTER — Ambulatory Visit (HOSPITAL_BASED_OUTPATIENT_CLINIC_OR_DEPARTMENT_OTHER): Payer: Managed Care, Other (non HMO) | Admitting: Family

## 2016-11-19 ENCOUNTER — Encounter: Payer: Self-pay | Admitting: Family

## 2016-11-19 ENCOUNTER — Other Ambulatory Visit: Payer: Medicare (Managed Care)

## 2016-11-19 VITALS — BP 137/73 | HR 58 | Temp 97.7°F | Wt 130.8 lb

## 2016-11-19 DIAGNOSIS — Z7901 Long term (current) use of anticoagulants: Secondary | ICD-10-CM

## 2016-11-19 DIAGNOSIS — I82511 Chronic embolism and thrombosis of right femoral vein: Secondary | ICD-10-CM | POA: Diagnosis not present

## 2016-11-19 DIAGNOSIS — I82411 Acute embolism and thrombosis of right femoral vein: Secondary | ICD-10-CM

## 2016-11-19 LAB — PROTHROMBIN TIME (PT)
INR: 4.6 — AB (ref 0.8–1.2)
PROTHROMBIN TIME: 46.2 s — AB (ref 9.1–12.0)

## 2016-11-19 LAB — PROTIME-INR (CHCC SATELLITE)

## 2016-11-19 LAB — CBC WITH DIFFERENTIAL (CANCER CENTER ONLY)
BASO#: 0 10*3/uL (ref 0.0–0.2)
BASO%: 0.5 % (ref 0.0–2.0)
EOS ABS: 0.2 10*3/uL (ref 0.0–0.5)
EOS%: 3.1 % (ref 0.0–7.0)
HCT: 32.3 % — ABNORMAL LOW (ref 38.7–49.9)
HEMOGLOBIN: 11 g/dL — AB (ref 13.0–17.1)
LYMPH#: 0.9 10*3/uL (ref 0.9–3.3)
LYMPH%: 14.4 % (ref 14.0–48.0)
MCH: 33.5 pg — AB (ref 28.0–33.4)
MCHC: 34.1 g/dL (ref 32.0–35.9)
MCV: 99 fL — ABNORMAL HIGH (ref 82–98)
MONO#: 0.7 10*3/uL (ref 0.1–0.9)
MONO%: 11.3 % (ref 0.0–13.0)
NEUT%: 70.7 % (ref 40.0–80.0)
NEUTROS ABS: 4.6 10*3/uL (ref 1.5–6.5)
PLATELETS: 306 10*3/uL (ref 145–400)
RBC: 3.28 10*6/uL — AB (ref 4.20–5.70)
RDW: 12.5 % (ref 11.1–15.7)
WBC: 6.5 10*3/uL (ref 4.0–10.0)

## 2016-11-19 LAB — CMP (CANCER CENTER ONLY)
ALT(SGPT): 28 U/L (ref 10–47)
AST: 28 U/L (ref 11–38)
Albumin: 2.9 g/dL — ABNORMAL LOW (ref 3.3–5.5)
Alkaline Phosphatase: 74 U/L (ref 26–84)
BUN: 23 mg/dL — AB (ref 7–22)
CHLORIDE: 106 meq/L (ref 98–108)
CO2: 29 meq/L (ref 18–33)
CREATININE: 0.9 mg/dL (ref 0.6–1.2)
Calcium: 7.1 mg/dL — ABNORMAL LOW (ref 8.0–10.3)
Glucose, Bld: 97 mg/dL (ref 73–118)
POTASSIUM: 3.1 meq/L — AB (ref 3.3–4.7)
SODIUM: 144 meq/L (ref 128–145)
TOTAL PROTEIN: 6.8 g/dL (ref 6.4–8.1)
Total Bilirubin: 0.4 mg/dl (ref 0.20–1.60)

## 2016-11-19 NOTE — Progress Notes (Signed)
Hematology and Oncology Follow Up Visit  Jayleon Mcfarlane [id number] 1956/04/30 61 y.o. 11/19/2016   Principle Diagnosis:  DVT of the right femoral, popliteal and calf veins  Current Therapy:   Coumadin 2.5 mg PO daily M, W, F, Sat, Sun and 5 mg PO daily T,Th     Interim History:  Mr. Guinta is here today with his brother for follow-up. He is doing well and has no complaints at this time. [place] today showed No evidence of acute thrombus in the right leg. 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 He was also positive for chronic nonocclusive DVT within the right popliteal vein.  INR today is 4.6. No episodes of bleeding, bruising or petechiae. His INR has been hard to regulate and he is not a condidate for one of the newer anticoagulants such as xarelto due to his also being on keppra and dilantin for seizures He has had no issue with frequent infections. No fever, chills, n/v, cough, rash, dizziness, SOB, chest pain, palpitations, abdominal pain or changes in bowel or bladder habits.  No syncope or seizures. No swelling, tenderness, numbness or tingling. No new aches or pains.  He has maintained a good appetite and is staying well hydrated. His weight is stable.  He is hoping to leave for [place] in May to visit family for several months.   Medications:  Allergies as of 11/19/2016      Reactions   Bee Venom Shortness Of Breath, Swelling   Phenytoin Sodium Nausea And Vomiting, Rash   MUST HAVE NAME BRAND DILANTIN      Medication List       Accurate as of 11/19/16  2:16 PM. Always use your most recent med list.          alendronate 70 MG tablet Commonly known as:  FOSAMAX Take 70 mg by mouth once a week. Take with a full glass of water on an empty stomach.   atenolol 25 MG tablet Commonly known as:  TENORMIN Take 25 mg by mouth daily.   cyanocobalamin 1000 MCG/ML injection Commonly  known as:  (VITAMIN B-12) Inject 1,000 mcg into the muscle every 30 (thirty) days.   levETIRAcetam 500 MG tablet Commonly known as:  KEPPRA Take 500 mg by mouth 2 (two) times daily.   multivitamin with minerals Tabs tablet Take 1 tablet by mouth daily.   phenytoin 100 MG ER capsule Commonly known as:  DILANTIN Take 200 mg by mouth 2 (two) times daily.   potassium chloride 10 MEQ CR capsule Commonly known as:  MICRO-K Take 10 mEq by mouth daily.   Vitamin D3 5000 units Caps Take 1 capsule by mouth daily.   warfarin 5 MG tablet Commonly known as:  COUMADIN TAKE 5 MG BY MOUTH DAILY AT 6 PM ON FRI., SUN., TUE., AND THURS. AND TAKE 2.5 MG DAILY ON SAT., MON., AND WED.       Allergies:  Allergies  Allergen Reactions  . Bee Venom Shortness Of Breath and Swelling  . Phenytoin Sodium Nausea And Vomiting and Rash    MUST HAVE NAME BRAND DILANTIN    Past Medical History, Surgical history, Social history, and Family History were reviewed and updated.  Review of Systems: All other 10 point review of systems is negative.   Physical Exam:  weight is 130 lb 12.8 oz (59.3 kg). His oral temperature is 97.7 F (36.5 C). His blood  pressure is 137/73 and his pulse is 58 (abnormal). His oxygen saturation is 100%.   Wt Readings from Last 3 Encounters:  11/19/16 130 lb 12.8 oz (59.3 kg)  10/22/16 128 lb 1.9 oz (58.1 kg)  09/10/16 130 lb 6.4 oz (59.1 kg)    Ocular: Sclerae unicteric, pupils equal, round and reactive to light Ear-nose-throat: Oropharynx clear, dentition fair Lymphatic: No cervical supraclavicular or axillary adenopathy Lungs no rales or rhonchi, good excursion bilaterally Heart regular rate and rhythm, no murmur appreciated Abd soft, nontender, positive bowel sounds, no liver or spleen tip palpated on exam, no fluid wave MSK no focal spinal tenderness, no joint edema Neuro: non-focal, well-oriented, appropriate affect Breasts: Deferred  Lab Results  Component  Value Date   WBC 6.5 11/19/2016   HGB 11.0 (L) 11/19/2016   HCT 32.3 (L) 11/19/2016   MCV 99 (H) 11/19/2016   PLT 306 11/19/2016   No results found for: FERRITIN, IRON, TIBC, UIBC, IRONPCTSAT Lab Results  Component Value Date   RBC 3.28 (L) 11/19/2016   No results found for: KPAFRELGTCHN, LAMBDASER, KAPLAMBRATIO No results found for: IGGSERUM, IGA, IGMSERUM No results found for: Will Bonnet, GAMS, MSPIKE, SPEI   Chemistry      Component Value Date/Time   NA 146 (H) 11/05/2016 1008   K 3.3 (L) 11/05/2016 1008   CL 103 10/22/2016 1328   CL 101 09/19/2016 1424   CO2 30 (H) 11/05/2016 1008   BUN 20.9 11/05/2016 1008   CREATININE 0.8 11/05/2016 1008      Component Value Date/Time   CALCIUM 7.4 (L) 11/05/2016 1008   ALKPHOS 70 11/05/2016 1008   AST 25 11/05/2016 1008   ALT 20 11/05/2016 1008   BILITOT 0.29 11/05/2016 1008     Impression and Plan: Mr. 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 He is currently on Coumadin 5 mg PO PO daily M, W, F, Sat, Sun and 2.5 mg PO daily T,Th.  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 He was also positive for chronic nonocclusive DVT within the right popliteal vein.  His INR at this time is 4.6. No changes with his Coumadin at this time per Dr. Marin Olp.  We will see him back in 1 month for labs and follow-up right before he goes back to [place] for the summer.  Both he and his family know to contact our office with any questions or concerns. We can certainly see him sooner if need be.   Eliezer Bottom, NP 3/21/20182:16 PM

## 2016-11-21 ENCOUNTER — Telehealth: Payer: Self-pay | Admitting: *Deleted

## 2016-11-21 NOTE — Telephone Encounter (Addendum)
Patient's brother aware of results  ----- Message from Eliezer Bottom, NP sent at 11/20/2016 11:43 AM EDT ----- Regarding: Coumadin INR 4.6, no change in medication dosage per Dr. Marin Olp. Thank you!  Sarah  ----- Message ----- From: Interface, Lab In Three Zero One Sent: 11/19/2016   5:36 PM To: Eliezer Bottom, NP

## 2016-11-26 ENCOUNTER — Telehealth: Payer: Self-pay | Admitting: *Deleted

## 2016-11-26 ENCOUNTER — Other Ambulatory Visit (HOSPITAL_BASED_OUTPATIENT_CLINIC_OR_DEPARTMENT_OTHER): Payer: Managed Care, Other (non HMO)

## 2016-11-26 ENCOUNTER — Other Ambulatory Visit: Payer: Medicare (Managed Care)

## 2016-11-26 DIAGNOSIS — I82411 Acute embolism and thrombosis of right femoral vein: Secondary | ICD-10-CM | POA: Diagnosis not present

## 2016-11-26 LAB — CBC WITH DIFFERENTIAL (CANCER CENTER ONLY)
BASO#: 0 10*3/uL (ref 0.0–0.2)
BASO%: 0.4 % (ref 0.0–2.0)
EOS ABS: 0.2 10*3/uL (ref 0.0–0.5)
EOS%: 2.8 % (ref 0.0–7.0)
HEMATOCRIT: 33.6 % — AB (ref 38.7–49.9)
HGB: 11.5 g/dL — ABNORMAL LOW (ref 13.0–17.1)
LYMPH#: 1.1 10*3/uL (ref 0.9–3.3)
LYMPH%: 13.6 % — AB (ref 14.0–48.0)
MCH: 33.7 pg — AB (ref 28.0–33.4)
MCHC: 34.2 g/dL (ref 32.0–35.9)
MCV: 99 fL — AB (ref 82–98)
MONO#: 1 10*3/uL — AB (ref 0.1–0.9)
MONO%: 12.2 % (ref 0.0–13.0)
NEUT#: 5.6 10*3/uL (ref 1.5–6.5)
NEUT%: 71 % (ref 40.0–80.0)
PLATELETS: 327 10*3/uL (ref 145–400)
RBC: 3.41 10*6/uL — AB (ref 4.20–5.70)
RDW: 12.2 % (ref 11.1–15.7)
WBC: 7.9 10*3/uL (ref 4.0–10.0)

## 2016-11-26 LAB — COMPREHENSIVE METABOLIC PANEL
ALK PHOS: 79 U/L (ref 40–150)
ALT: 25 U/L (ref 0–55)
ANION GAP: 9 meq/L (ref 3–11)
AST: 29 U/L (ref 5–34)
Albumin: 3.1 g/dL — ABNORMAL LOW (ref 3.5–5.0)
BILIRUBIN TOTAL: 0.34 mg/dL (ref 0.20–1.20)
BUN: 23.2 mg/dL (ref 7.0–26.0)
CALCIUM: 6.8 mg/dL — AB (ref 8.4–10.4)
CO2: 30 meq/L — AB (ref 22–29)
CREATININE: 0.8 mg/dL (ref 0.7–1.3)
Chloride: 105 mEq/L (ref 98–109)
Glucose: 64 mg/dl — ABNORMAL LOW (ref 70–140)
Potassium: 3.4 mEq/L — ABNORMAL LOW (ref 3.5–5.1)
Sodium: 144 mEq/L (ref 136–145)
TOTAL PROTEIN: 7.1 g/dL (ref 6.4–8.3)

## 2016-11-26 LAB — PROTHROMBIN TIME (PT)
INR: 3.3 — ABNORMAL HIGH (ref 0.8–1.2)
Prothrombin Time: 33.9 s — ABNORMAL HIGH (ref 9.1–12.0)

## 2016-11-26 LAB — PROTIME-INR (CHCC SATELLITE)

## 2016-11-26 NOTE — Telephone Encounter (Addendum)
Patient's brother aware of results  ----- Message from Volanda Napoleon, MD sent at 11/26/2016  1:59 PM EDT ----- Call hsi brother -- INR is perfect!!  pete

## 2016-12-03 ENCOUNTER — Other Ambulatory Visit (HOSPITAL_BASED_OUTPATIENT_CLINIC_OR_DEPARTMENT_OTHER): Payer: Managed Care, Other (non HMO)

## 2016-12-03 DIAGNOSIS — I82411 Acute embolism and thrombosis of right femoral vein: Secondary | ICD-10-CM | POA: Diagnosis not present

## 2016-12-03 LAB — COMPREHENSIVE METABOLIC PANEL (CC13)
ALT: 23 IU/L (ref 0–44)
AST: 24 IU/L (ref 0–40)
Albumin, Serum: 3.3 g/dL — ABNORMAL LOW (ref 3.5–5.5)
Albumin/Globulin Ratio: 1 — ABNORMAL LOW (ref 1.2–2.2)
Alkaline Phosphatase, S: 76 IU/L (ref 39–117)
BUN/Creatinine Ratio: 20 (ref 9–20)
BUN: 18 mg/dL (ref 6–24)
Bilirubin Total: 0.2 mg/dL (ref 0.0–1.2)
CALCIUM: 6.8 mg/dL — AB (ref 8.7–10.2)
CO2: 29 mmol/L (ref 18–29)
CREATININE: 0.92 mg/dL (ref 0.76–1.27)
Chloride, Ser: 103 mmol/L (ref 96–106)
GFR calc Af Amer: 105 mL/min/{1.73_m2} (ref >59)
GFR, EST NON AFRICAN AMERICAN: 91 mL/min/{1.73_m2} (ref >59)
GLOBULIN, TOTAL: 3.4 g/dL (ref 1.5–4.5)
GLUCOSE: 90 mg/dL (ref 65–99)
Potassium, Ser: 3.3 mmol/L — ABNORMAL LOW (ref 3.5–5.2)
Sodium: 137 mmol/L (ref 134–144)
Total Protein: 6.7 g/dL (ref 6.0–8.5)

## 2016-12-03 LAB — CBC WITH DIFFERENTIAL (CANCER CENTER ONLY)
BASO#: 0 10*3/uL (ref 0.0–0.2)
BASO%: 0.5 % (ref 0.0–2.0)
EOS ABS: 0.3 10*3/uL (ref 0.0–0.5)
EOS%: 4.3 % (ref 0.0–7.0)
HEMATOCRIT: 33.3 % — AB (ref 38.7–49.9)
HEMOGLOBIN: 11.2 g/dL — AB (ref 13.0–17.1)
LYMPH#: 0.9 10*3/uL (ref 0.9–3.3)
LYMPH%: 15.2 % (ref 14.0–48.0)
MCH: 33.2 pg (ref 28.0–33.4)
MCHC: 33.6 g/dL (ref 32.0–35.9)
MCV: 99 fL — ABNORMAL HIGH (ref 82–98)
MONO#: 0.7 10*3/uL (ref 0.1–0.9)
MONO%: 12.3 % (ref 0.0–13.0)
NEUT%: 67.7 % (ref 40.0–80.0)
NEUTROS ABS: 4.1 10*3/uL (ref 1.5–6.5)
Platelets: 297 10*3/uL (ref 145–400)
RBC: 3.37 10*6/uL — AB (ref 4.20–5.70)
RDW: 12.2 % (ref 11.1–15.7)
WBC: 6 10*3/uL (ref 4.0–10.0)

## 2016-12-03 LAB — PROTIME-INR (CHCC SATELLITE)
INR: 4 — AB (ref 2.0–3.5)
Protime: 48 Seconds — ABNORMAL HIGH (ref 10.6–13.4)

## 2016-12-04 ENCOUNTER — Other Ambulatory Visit: Payer: Self-pay | Admitting: Family

## 2016-12-04 ENCOUNTER — Telehealth: Payer: Self-pay | Admitting: *Deleted

## 2016-12-04 NOTE — Telephone Encounter (Signed)
-----   Message from Eliezer Bottom, NP sent at 12/03/2016 10:48 PM EDT ----- Regarding: INR INR therapeutic, no change to Coumadin needed at this time. Thank you!  Sarah  ----- Message ----- From: Interface, Lab In Three Zero One Sent: 12/03/2016   1:42 PM To: Eliezer Bottom, NP

## 2016-12-04 NOTE — Progress Notes (Signed)
Labs routed to PCP to determine if they would like to replace his calcium.

## 2016-12-10 ENCOUNTER — Other Ambulatory Visit (HOSPITAL_BASED_OUTPATIENT_CLINIC_OR_DEPARTMENT_OTHER): Payer: Managed Care, Other (non HMO)

## 2016-12-10 DIAGNOSIS — I82411 Acute embolism and thrombosis of right femoral vein: Secondary | ICD-10-CM | POA: Diagnosis not present

## 2016-12-10 LAB — COMPREHENSIVE METABOLIC PANEL (CC13)
ALT: 23 IU/L (ref 0–44)
AST (SGOT): 34 IU/L (ref 0–40)
Albumin, Serum: 3.7 g/dL (ref 3.5–5.5)
Albumin/Globulin Ratio: 1.1 — ABNORMAL LOW (ref 1.2–2.2)
Alkaline Phosphatase, S: 81 IU/L (ref 39–117)
BUN/Creatinine Ratio: 21 — ABNORMAL HIGH (ref 9–20)
BUN: 19 mg/dL (ref 6–24)
Bilirubin Total: <0.2 mg/dL (ref 0.0–1.2)
Calcium, Ser: 6.4 mg/dL — CL (ref 8.7–10.2)
Carbon Dioxide, Total: 26 mmol/L (ref 18–29)
Chloride, Ser: 103 mmol/L (ref 96–106)
Creatinine, Ser: 0.9 mg/dL (ref 0.76–1.27)
GFR calc Af Amer: 108 mL/min/1.73
GFR calc non Af Amer: 93 mL/min/1.73
Globulin, Total: 3.3 g/dL (ref 1.5–4.5)
Glucose: 82 mg/dL (ref 65–99)
Potassium, Ser: 3.3 mmol/L — ABNORMAL LOW (ref 3.5–5.2)
Sodium: 146 mmol/L — ABNORMAL HIGH (ref 134–144)
Total Protein: 7 g/dL (ref 6.0–8.5)

## 2016-12-10 LAB — PROTIME-INR (CHCC SATELLITE)

## 2016-12-10 LAB — CBC WITH DIFFERENTIAL (CANCER CENTER ONLY)
BASO#: 0 10e3/uL (ref 0.0–0.2)
BASO%: 0.6 % (ref 0.0–2.0)
EOS%: 3.1 % (ref 0.0–7.0)
Eosinophils Absolute: 0.2 10e3/uL (ref 0.0–0.5)
HCT: 33.3 % — ABNORMAL LOW (ref 38.7–49.9)
HGB: 11.4 g/dL — ABNORMAL LOW (ref 13.0–17.1)
LYMPH#: 0.9 10e3/uL (ref 0.9–3.3)
LYMPH%: 13.1 % — ABNORMAL LOW (ref 14.0–48.0)
MCH: 33.3 pg (ref 28.0–33.4)
MCHC: 34.2 g/dL (ref 32.0–35.9)
MCV: 97 fL (ref 82–98)
MONO#: 0.9 10e3/uL (ref 0.1–0.9)
MONO%: 12.4 % (ref 0.0–13.0)
NEUT#: 5.1 10e3/uL (ref 1.5–6.5)
NEUT%: 70.8 % (ref 40.0–80.0)
Platelets: 323 10e3/uL (ref 145–400)
RBC: 3.42 10e6/uL — ABNORMAL LOW (ref 4.20–5.70)
RDW: 12.3 % (ref 11.1–15.7)
WBC: 7.2 10e3/uL (ref 4.0–10.0)

## 2016-12-10 LAB — PROTHROMBIN TIME (PT)
INR: 4.4 — ABNORMAL HIGH (ref 0.8–1.2)
Prothrombin Time: 41.7 s — ABNORMAL HIGH (ref 9.1–12.0)

## 2016-12-11 ENCOUNTER — Telehealth: Payer: Self-pay | Admitting: *Deleted

## 2016-12-11 NOTE — Telephone Encounter (Addendum)
Patient is aware of results. Patient's calcium level is also low. Dr Marin Olp wants brother to know to take 2 TUMS daily to help increase the calcium. Brother is aware.   ----- Message from Eliezer Bottom, NP sent at 12/11/2016  9:01 AM EDT ----- Regarding: INR No change in Coumadin per Dr. Marin Olp. Thank you!  Sarah  ----- Message ----- From: Interface, Lab In Three Zero One Sent: 12/10/2016   1:57 PM To: Eliezer Bottom, NP

## 2016-12-17 ENCOUNTER — Other Ambulatory Visit: Payer: Medicare (Managed Care)

## 2016-12-17 ENCOUNTER — Other Ambulatory Visit (HOSPITAL_BASED_OUTPATIENT_CLINIC_OR_DEPARTMENT_OTHER): Payer: Managed Care, Other (non HMO)

## 2016-12-17 ENCOUNTER — Ambulatory Visit (HOSPITAL_BASED_OUTPATIENT_CLINIC_OR_DEPARTMENT_OTHER): Payer: Managed Care, Other (non HMO) | Admitting: Family

## 2016-12-17 VITALS — BP 133/75 | HR 58 | Temp 97.6°F | Resp 16 | Wt 126.8 lb

## 2016-12-17 DIAGNOSIS — Z7901 Long term (current) use of anticoagulants: Secondary | ICD-10-CM

## 2016-12-17 DIAGNOSIS — I824Z1 Acute embolism and thrombosis of unspecified deep veins of right distal lower extremity: Secondary | ICD-10-CM

## 2016-12-17 DIAGNOSIS — I82411 Acute embolism and thrombosis of right femoral vein: Secondary | ICD-10-CM | POA: Diagnosis not present

## 2016-12-17 DIAGNOSIS — I82431 Acute embolism and thrombosis of right popliteal vein: Secondary | ICD-10-CM

## 2016-12-17 LAB — COMPREHENSIVE METABOLIC PANEL (CC13)
ALT: 22 IU/L (ref 0–44)
AST: 23 IU/L (ref 0–40)
Albumin, Serum: 3.3 g/dL — ABNORMAL LOW (ref 3.5–5.5)
Albumin/Globulin Ratio: 1 — ABNORMAL LOW (ref 1.2–2.2)
Alkaline Phosphatase, S: 76 IU/L (ref 39–117)
BUN/Creatinine Ratio: 25 — ABNORMAL HIGH (ref 9–20)
BUN: 21 mg/dL (ref 6–24)
Bilirubin Total: 0.2 mg/dL (ref 0.0–1.2)
CALCIUM: 6.5 mg/dL — AB (ref 8.7–10.2)
CREATININE: 0.85 mg/dL (ref 0.76–1.27)
Carbon Dioxide, Total: 30 mmol/L — ABNORMAL HIGH (ref 18–29)
Chloride, Ser: 101 mmol/L (ref 96–106)
GFR calc Af Amer: 110 mL/min/{1.73_m2} (ref >59)
GFR, EST NON AFRICAN AMERICAN: 95 mL/min/{1.73_m2} (ref >59)
GLOBULIN, TOTAL: 3.3 g/dL (ref 1.5–4.5)
Glucose: 73 mg/dL (ref 65–99)
Potassium, Ser: 3.4 mmol/L — ABNORMAL LOW (ref 3.5–5.2)
SODIUM: 138 mmol/L (ref 134–144)
Total Protein: 6.6 g/dL (ref 6.0–8.5)

## 2016-12-17 LAB — CBC WITH DIFFERENTIAL (CANCER CENTER ONLY)
BASO#: 0.1 10*3/uL (ref 0.0–0.2)
BASO%: 0.9 % (ref 0.0–2.0)
EOS ABS: 0.2 10*3/uL (ref 0.0–0.5)
EOS%: 3.6 % (ref 0.0–7.0)
HEMATOCRIT: 32 % — AB (ref 38.7–49.9)
HEMOGLOBIN: 10.9 g/dL — AB (ref 13.0–17.1)
LYMPH#: 0.9 10*3/uL (ref 0.9–3.3)
LYMPH%: 15.9 % (ref 14.0–48.0)
MCH: 33.7 pg — ABNORMAL HIGH (ref 28.0–33.4)
MCHC: 34.1 g/dL (ref 32.0–35.9)
MCV: 99 fL — ABNORMAL HIGH (ref 82–98)
MONO#: 0.7 10*3/uL (ref 0.1–0.9)
MONO%: 12.6 % (ref 0.0–13.0)
NEUT%: 67 % (ref 40.0–80.0)
NEUTROS ABS: 3.9 10*3/uL (ref 1.5–6.5)
Platelets: 307 10*3/uL (ref 145–400)
RBC: 3.23 10*6/uL — ABNORMAL LOW (ref 4.20–5.70)
RDW: 12.2 % (ref 11.1–15.7)
WBC: 5.8 10*3/uL (ref 4.0–10.0)

## 2016-12-17 LAB — PROTIME-INR (CHCC SATELLITE)
INR: 3 (ref 2.0–3.5)
Protime: 36 Seconds — ABNORMAL HIGH (ref 10.6–13.4)

## 2016-12-17 NOTE — Progress Notes (Signed)
Hematology and Oncology Follow Up Visit  Rick Walsh [id number] February 05, 1956 61 y.o. 12/17/2016   Principle Diagnosis:  DVT of the right femoral, popliteal and calf veins  Current Therapy:   Coumadin 2.5 mg PO daily M, W, F, Sat, Sun and 5 mg PO daily T,Th    Interim History:  Rick Walsh is here today with his brother for follow-up. He is doing well and has no complaints. His INR is therapeutic at 3.0. He has had no episodes of bleeding, bruising or petechiae. Hgb is stable at 14.6 with an MCV of 81. He verbalized that he is taking his Coumadin as directed.  No c/o post phlebitic pain. He has occasional puffiness in the right ankle when he is on his feet for an extended period of time. Pedal pulses are +2.  He will be leaving for [place] in May so we will plan to see him back one more time before he goes.  He has had no issue with fever, chills, n/v, cough, rash, dizziness, SOB, chest pain, palpitations, abdominal pain or changes on bowel or bladder habits.  No tenderness, numbness or tingling in his extremities. No c/o pain.  He has maintained a good appetite and is staying well hydrated. His weight is stable.   ECOG Performance Status: 1 - Symptomatic but completely ambulatory  Medications:  Allergies as of 12/17/2016      Reactions   Bee Venom Shortness Of Breath, Swelling   Phenytoin Sodium Nausea And Vomiting, Rash   MUST HAVE NAME BRAND DILANTIN      Medication List       Accurate as of 12/17/16  4:19 PM. Always use your most recent med list.          alendronate 70 MG tablet Commonly known as:  FOSAMAX Take 70 mg by mouth once a week. Take with a full glass of water on an empty stomach.   atenolol 25 MG tablet Commonly known as:  TENORMIN Take 25 mg by mouth daily.   cyanocobalamin 1000 MCG/ML injection Commonly known as:  (VITAMIN B-12) Inject 1,000 mcg into the muscle every 30 (thirty) days.   levETIRAcetam 500 MG tablet Commonly known as:  KEPPRA Take 500  mg by mouth 2 (two) times daily.   multivitamin with minerals Tabs tablet Take 1 tablet by mouth daily.   phenytoin 100 MG ER capsule Commonly known as:  DILANTIN Take 200 mg by mouth 2 (two) times daily.   potassium chloride 10 MEQ CR capsule Commonly known as:  MICRO-K Take 10 mEq by mouth daily.   Vitamin D3 5000 units Caps Take 1 capsule by mouth daily.   warfarin 5 MG tablet Commonly known as:  COUMADIN TAKE 5 MG BY MOUTH DAILY AT 6 PM ON FRI., SUN., TUE., AND THURS. AND TAKE 2.5 MG DAILY ON SAT., MON., AND WED.       Allergies:  Allergies  Allergen Reactions  . Bee Venom Shortness Of Breath and Swelling  . Phenytoin Sodium Nausea And Vomiting and Rash    MUST HAVE NAME BRAND DILANTIN    Past Medical History, Surgical history, Social history, and Family History were reviewed and updated.  Review of Systems: All other 10 point review of systems is negative.   Physical Exam:  weight is 126 lb 12.8 oz (57.5 kg). His oral temperature is 97.6 F (36.4 C). His blood pressure is 133/75 and his pulse is 58 (abnormal). His respiration is 16 and oxygen saturation is 100%.  Wt Readings from Last 3 Encounters:  12/17/16 126 lb 12.8 oz (57.5 kg)  11/19/16 130 lb 12.8 oz (59.3 kg)  10/22/16 128 lb 1.9 oz (58.1 kg)    Ocular: Sclerae unicteric, pupils equal, round and reactive to light Ear-nose-throat: Oropharynx clear, dentition fair Lymphatic: No cervical, supraclavicular or axillary adenopathy Lungs no rales or rhonchi, good excursion bilaterally Heart regular rate and rhythm, no murmur appreciated Abd soft, nontender, positive bowel sounds, no liver or spleen tip palpated on exam, no fluid wave MSK no focal spinal tenderness, no joint edema Neuro: non-focal, well-oriented, appropriate affect Breasts: Deferred  Lab Results  Component Value Date   WBC 5.8 12/17/2016   HGB 10.9 (L) 12/17/2016   HCT 32.0 (L) 12/17/2016   MCV 99 (H) 12/17/2016   PLT 307 12/17/2016     No results found for: FERRITIN, IRON, TIBC, UIBC, IRONPCTSAT Lab Results  Component Value Date   RBC 3.23 (L) 12/17/2016   No results found for: KPAFRELGTCHN, LAMBDASER, KAPLAMBRATIO No results found for: IGGSERUM, IGA, IGMSERUM No results found for: Kathrynn Ducking, MSPIKE, SPEI   Chemistry      Component Value Date/Time   NA 146 (H) 12/10/2016 1343   NA 144 11/26/2016 1124   K 3.3 (L) 12/10/2016 1343   K 3.4 (L) 11/26/2016 1124   CL 103 12/10/2016 1343   CL 106 11/19/2016 1331   CO2 26 12/10/2016 1343   CO2 30 (H) 11/26/2016 1124   BUN 19 12/10/2016 1343   BUN 23.2 11/26/2016 1124   CREATININE 0.90 12/10/2016 1343   CREATININE 0.8 11/26/2016 1124      Component Value Date/Time   CALCIUM 6.4 (LL) 12/10/2016 1343   CALCIUM 6.8 (L) 11/26/2016 1124   ALKPHOS 81 12/10/2016 1343   ALKPHOS 79 11/26/2016 1124   AST 34 12/10/2016 1343   AST 29 11/26/2016 1124   ALT 23 12/10/2016 1343   ALT 25 11/26/2016 1124   BILITOT <0.2 12/10/2016 1343   BILITOT 0.34 11/26/2016 1124      Impression and Plan: Rick Walsh is a 61 yo white male with 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 He is currently on Coumadin 5 mg PO PO daily M, W, F, Sat, Sun and 2.5 mg PO daily T,Th.  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  INR is therapeutic at 3.0 so there will be no change to his Coumadin regimen. We will see him back in 1 month for labs and follow-up right before he goes back to [place] for the summer. He will continue to have his INR checked weekly until he heads out [place].  I spent 15 minutes face to face counseling the patient and his brother.  Both he and his family know to contact our office with any questions or concerns.  We can certainly see him sooner if need be.   Eliezer Bottom, NP 4/18/20184:19 PM

## 2016-12-24 ENCOUNTER — Other Ambulatory Visit (HOSPITAL_BASED_OUTPATIENT_CLINIC_OR_DEPARTMENT_OTHER): Payer: Managed Care, Other (non HMO)

## 2016-12-24 DIAGNOSIS — I82411 Acute embolism and thrombosis of right femoral vein: Secondary | ICD-10-CM | POA: Diagnosis not present

## 2016-12-24 LAB — CBC WITH DIFFERENTIAL (CANCER CENTER ONLY)
BASO#: 0 10*3/uL (ref 0.0–0.2)
BASO%: 0.4 % (ref 0.0–2.0)
EOS%: 3 % (ref 0.0–7.0)
Eosinophils Absolute: 0.2 10*3/uL (ref 0.0–0.5)
HCT: 31.7 % — ABNORMAL LOW (ref 38.7–49.9)
HEMOGLOBIN: 10.7 g/dL — AB (ref 13.0–17.1)
LYMPH#: 1 10*3/uL (ref 0.9–3.3)
LYMPH%: 13.4 % — ABNORMAL LOW (ref 14.0–48.0)
MCH: 33.5 pg — ABNORMAL HIGH (ref 28.0–33.4)
MCHC: 33.8 g/dL (ref 32.0–35.9)
MCV: 99 fL — ABNORMAL HIGH (ref 82–98)
MONO#: 0.9 10*3/uL (ref 0.1–0.9)
MONO%: 12.4 % (ref 0.0–13.0)
NEUT%: 70.8 % (ref 40.0–80.0)
NEUTROS ABS: 5.3 10*3/uL (ref 1.5–6.5)
Platelets: 321 10*3/uL (ref 145–400)
RBC: 3.19 10*6/uL — ABNORMAL LOW (ref 4.20–5.70)
RDW: 12.1 % (ref 11.1–15.7)
WBC: 7.4 10*3/uL (ref 4.0–10.0)

## 2016-12-24 LAB — PROTIME-INR (CHCC SATELLITE)
INR: 3.9 — AB (ref 2.0–3.5)
PROTIME: 46.8 s — AB (ref 10.6–13.4)

## 2016-12-25 ENCOUNTER — Telehealth: Payer: Self-pay | Admitting: *Deleted

## 2016-12-25 LAB — COMPREHENSIVE METABOLIC PANEL (CC13)
A/G RATIO: 1 — AB (ref 1.2–2.2)
ALT: 17 IU/L (ref 0–44)
AST (SGOT): 31 IU/L (ref 0–40)
Albumin, Serum: 3.5 g/dL (ref 3.5–5.5)
Alkaline Phosphatase, S: 75 IU/L (ref 39–117)
BUN/Creatinine Ratio: 31 — ABNORMAL HIGH (ref 9–20)
BUN: 26 mg/dL — ABNORMAL HIGH (ref 6–24)
Bilirubin Total: 0.2 mg/dL (ref 0.0–1.2)
Calcium, Ser: 6.7 mg/dL — CL (ref 8.7–10.2)
Carbon Dioxide, Total: 29 mmol/L (ref 18–29)
Chloride, Ser: 102 mmol/L (ref 96–106)
Creatinine, Ser: 0.85 mg/dL (ref 0.76–1.27)
GFR calc Af Amer: 110 mL/min/{1.73_m2} (ref >59)
GFR calc non Af Amer: 95 mL/min/{1.73_m2} (ref >59)
Globulin, Total: 3.5 g/dL (ref 1.5–4.5)
Glucose: 81 mg/dL (ref 65–99)
POTASSIUM: 3.9 mmol/L (ref 3.5–5.2)
Sodium: 142 mmol/L (ref 134–144)
TOTAL PROTEIN: 7 g/dL (ref 6.0–8.5)

## 2016-12-25 NOTE — Telephone Encounter (Addendum)
Patient is aware of results. Patient's brother will follow up with physician who prescribes Dilantin regarding the continued low calcium. Patient currently taking 800mg  Calcium daily.  ----- Message from Eliezer Bottom, NP sent at 12/24/2016  5:26 PM EDT ----- Regarding: INR Looks good. No change!  Sarah  ----- Message ----- From: Interface, Lab In Three Zero One Sent: 12/24/2016   1:28 PM To: Eliezer Bottom, NP

## 2016-12-31 ENCOUNTER — Telehealth: Payer: Self-pay | Admitting: *Deleted

## 2016-12-31 ENCOUNTER — Other Ambulatory Visit (HOSPITAL_BASED_OUTPATIENT_CLINIC_OR_DEPARTMENT_OTHER): Payer: Managed Care, Other (non HMO)

## 2016-12-31 DIAGNOSIS — I82411 Acute embolism and thrombosis of right femoral vein: Secondary | ICD-10-CM

## 2016-12-31 LAB — CBC WITH DIFFERENTIAL (CANCER CENTER ONLY)
BASO#: 0 10*3/uL (ref 0.0–0.2)
BASO%: 0.5 % (ref 0.0–2.0)
EOS ABS: 0.1 10*3/uL (ref 0.0–0.5)
EOS%: 1.8 % (ref 0.0–7.0)
HEMATOCRIT: 32 % — AB (ref 38.7–49.9)
HEMOGLOBIN: 10.8 g/dL — AB (ref 13.0–17.1)
LYMPH#: 0.8 10*3/uL — ABNORMAL LOW (ref 0.9–3.3)
LYMPH%: 13.3 % — AB (ref 14.0–48.0)
MCH: 34 pg — ABNORMAL HIGH (ref 28.0–33.4)
MCHC: 33.8 g/dL (ref 32.0–35.9)
MCV: 101 fL — ABNORMAL HIGH (ref 82–98)
MONO#: 0.6 10*3/uL (ref 0.1–0.9)
MONO%: 10.3 % (ref 0.0–13.0)
NEUT#: 4.5 10*3/uL (ref 1.5–6.5)
NEUT%: 74.1 % (ref 40.0–80.0)
PLATELETS: 333 10*3/uL (ref 145–400)
RBC: 3.18 10*6/uL — AB (ref 4.20–5.70)
RDW: 12.6 % (ref 11.1–15.7)
WBC: 6 10*3/uL (ref 4.0–10.0)

## 2016-12-31 LAB — CMP (CANCER CENTER ONLY)
ALT(SGPT): 34 U/L (ref 10–47)
AST: 36 U/L (ref 11–38)
Albumin: 3 g/dL — ABNORMAL LOW (ref 3.3–5.5)
Alkaline Phosphatase: 75 U/L (ref 26–84)
BUN: 29 mg/dL — AB (ref 7–22)
CHLORIDE: 106 meq/L (ref 98–108)
CO2: 29 meq/L (ref 18–33)
Calcium: 7.4 mg/dL — ABNORMAL LOW (ref 8.0–10.3)
Creat: 1 mg/dl (ref 0.6–1.2)
GLUCOSE: 73 mg/dL (ref 73–118)
POTASSIUM: 3.6 meq/L (ref 3.3–4.7)
SODIUM: 146 meq/L — AB (ref 128–145)
Total Bilirubin: 0.5 mg/dl (ref 0.20–1.60)
Total Protein: 7.2 g/dL (ref 6.4–8.1)

## 2016-12-31 LAB — PROTIME-INR (CHCC SATELLITE)
INR: 2.7 (ref 2.0–3.5)
Protime: 32.4 Seconds — ABNORMAL HIGH (ref 10.6–13.4)

## 2016-12-31 NOTE — Telephone Encounter (Addendum)
Patient's brother aware of results  ----- Message from Eliezer Bottom, NP sent at 12/31/2016  1:50 PM EDT ----- Regarding: INR Looks good! No change :)  Sarah  ----- Message ----- From: Interface, Lab In Three Zero One Sent: 12/31/2016   1:32 PM To: Eliezer Bottom, NP

## 2017-01-07 ENCOUNTER — Other Ambulatory Visit (HOSPITAL_BASED_OUTPATIENT_CLINIC_OR_DEPARTMENT_OTHER): Payer: Managed Care, Other (non HMO)

## 2017-01-07 DIAGNOSIS — I82411 Acute embolism and thrombosis of right femoral vein: Secondary | ICD-10-CM

## 2017-01-07 LAB — CBC WITH DIFFERENTIAL (CANCER CENTER ONLY)
BASO#: 0 10*3/uL (ref 0.0–0.2)
BASO%: 0.4 % (ref 0.0–2.0)
EOS%: 2.4 % (ref 0.0–7.0)
Eosinophils Absolute: 0.2 10*3/uL (ref 0.0–0.5)
HEMATOCRIT: 32.3 % — AB (ref 38.7–49.9)
HGB: 11 g/dL — ABNORMAL LOW (ref 13.0–17.1)
LYMPH#: 1 10*3/uL (ref 0.9–3.3)
LYMPH%: 14.1 % (ref 14.0–48.0)
MCH: 34.2 pg — ABNORMAL HIGH (ref 28.0–33.4)
MCHC: 34.1 g/dL (ref 32.0–35.9)
MCV: 100 fL — ABNORMAL HIGH (ref 82–98)
MONO#: 0.8 10*3/uL (ref 0.1–0.9)
MONO%: 11 % (ref 0.0–13.0)
NEUT%: 72.1 % (ref 40.0–80.0)
NEUTROS ABS: 5.3 10*3/uL (ref 1.5–6.5)
Platelets: 313 10*3/uL (ref 145–400)
RBC: 3.22 10*6/uL — AB (ref 4.20–5.70)
RDW: 12.7 % (ref 11.1–15.7)
WBC: 7.4 10*3/uL (ref 4.0–10.0)

## 2017-01-07 LAB — CMP (CANCER CENTER ONLY)
ALK PHOS: 76 U/L (ref 26–84)
ALT(SGPT): 30 U/L (ref 10–47)
AST: 37 U/L (ref 11–38)
Albumin: 2.9 g/dL — ABNORMAL LOW (ref 3.3–5.5)
BUN: 24 mg/dL — AB (ref 7–22)
CHLORIDE: 106 meq/L (ref 98–108)
CO2: 27 mEq/L (ref 18–33)
CREATININE: 1.1 mg/dL (ref 0.6–1.2)
Calcium: 7.1 mg/dL — ABNORMAL LOW (ref 8.0–10.3)
GLUCOSE: 67 mg/dL — AB (ref 73–118)
Potassium: 3.3 mEq/L (ref 3.3–4.7)
SODIUM: 140 meq/L (ref 128–145)
TOTAL PROTEIN: 6.9 g/dL (ref 6.4–8.1)
Total Bilirubin: 0.5 mg/dl (ref 0.20–1.60)

## 2017-01-07 LAB — PROTIME-INR (CHCC SATELLITE)
INR: 4.3 — AB (ref 2.0–3.5)
Protime: 51.6 Seconds — ABNORMAL HIGH (ref 10.6–13.4)

## 2017-01-08 ENCOUNTER — Telehealth: Payer: Self-pay | Admitting: *Deleted

## 2017-01-08 NOTE — Telephone Encounter (Addendum)
Patients brother aware of results  ----- Message from Eliezer Bottom, NP sent at 01/07/2017  4:35 PM EDT ----- Regarding: INR INR elevated at 4.3. Please have him hold his coumadin for the next 2 days and then restart back on his normal regimen. Thank you!  Sarah  ----- Message ----- From: Interface, Lab In Three Zero One Sent: 01/07/2017   2:27 PM To: Eliezer Bottom, NP

## 2017-01-14 ENCOUNTER — Other Ambulatory Visit (HOSPITAL_BASED_OUTPATIENT_CLINIC_OR_DEPARTMENT_OTHER): Payer: Managed Care, Other (non HMO)

## 2017-01-14 DIAGNOSIS — I82411 Acute embolism and thrombosis of right femoral vein: Secondary | ICD-10-CM

## 2017-01-14 LAB — CMP (CANCER CENTER ONLY)
ALT(SGPT): 37 U/L (ref 10–47)
AST: 33 U/L (ref 11–38)
Albumin: 2.9 g/dL — ABNORMAL LOW (ref 3.3–5.5)
Alkaline Phosphatase: 74 U/L (ref 26–84)
BUN: 23 mg/dL — AB (ref 7–22)
CHLORIDE: 101 meq/L (ref 98–108)
CO2: 29 mEq/L (ref 18–33)
Calcium: 6.8 mg/dL — ABNORMAL LOW (ref 8.0–10.3)
Creat: 1.2 mg/dl (ref 0.6–1.2)
Glucose, Bld: 77 mg/dL (ref 73–118)
POTASSIUM: 3.4 meq/L (ref 3.3–4.7)
Sodium: 140 mEq/L (ref 128–145)
TOTAL PROTEIN: 7 g/dL (ref 6.4–8.1)
Total Bilirubin: 0.4 mg/dl (ref 0.20–1.60)

## 2017-01-14 LAB — CBC WITH DIFFERENTIAL (CANCER CENTER ONLY)
BASO#: 0 10*3/uL (ref 0.0–0.2)
BASO%: 0.4 % (ref 0.0–2.0)
EOS%: 2.9 % (ref 0.0–7.0)
Eosinophils Absolute: 0.2 10*3/uL (ref 0.0–0.5)
HCT: 32.4 % — ABNORMAL LOW (ref 38.7–49.9)
HGB: 11.1 g/dL — ABNORMAL LOW (ref 13.0–17.1)
LYMPH#: 1 10*3/uL (ref 0.9–3.3)
LYMPH%: 18.8 % (ref 14.0–48.0)
MCH: 34 pg — ABNORMAL HIGH (ref 28.0–33.4)
MCHC: 34.3 g/dL (ref 32.0–35.9)
MCV: 99 fL — AB (ref 82–98)
MONO#: 0.7 10*3/uL (ref 0.1–0.9)
MONO%: 12.5 % (ref 0.0–13.0)
NEUT#: 3.6 10*3/uL (ref 1.5–6.5)
NEUT%: 65.4 % (ref 40.0–80.0)
PLATELETS: 310 10*3/uL (ref 145–400)
RBC: 3.26 10*6/uL — ABNORMAL LOW (ref 4.20–5.70)
RDW: 12.1 % (ref 11.1–15.7)
WBC: 5.4 10*3/uL (ref 4.0–10.0)

## 2017-01-14 LAB — PROTIME-INR (CHCC SATELLITE)
INR: 2.5 (ref 2.0–3.5)
PROTIME: 30 s — AB (ref 10.6–13.4)

## 2017-01-15 ENCOUNTER — Telehealth: Payer: Self-pay | Admitting: *Deleted

## 2017-01-15 NOTE — Telephone Encounter (Addendum)
Left Message  ----- Message from Eliezer Bottom, NP sent at 01/14/2017  1:56 PM EDT ----- Regarding: INR INR ok, no change in coumadin dose. Thank you!  Rick Walsh  ----- Message ----- From: Interface, Lab In Three Zero One Sent: 01/14/2017   1:45 PM To: Eliezer Bottom, NP

## 2017-01-21 ENCOUNTER — Other Ambulatory Visit (HOSPITAL_BASED_OUTPATIENT_CLINIC_OR_DEPARTMENT_OTHER): Payer: Managed Care, Other (non HMO)

## 2017-01-21 ENCOUNTER — Ambulatory Visit (HOSPITAL_BASED_OUTPATIENT_CLINIC_OR_DEPARTMENT_OTHER): Payer: Managed Care, Other (non HMO) | Admitting: Family

## 2017-01-21 VITALS — BP 130/67 | HR 58 | Temp 97.8°F | Resp 16 | Wt 126.1 lb

## 2017-01-21 DIAGNOSIS — Z7901 Long term (current) use of anticoagulants: Secondary | ICD-10-CM | POA: Diagnosis not present

## 2017-01-21 DIAGNOSIS — I82411 Acute embolism and thrombosis of right femoral vein: Secondary | ICD-10-CM

## 2017-01-21 LAB — COMPREHENSIVE METABOLIC PANEL (CC13)
ALBUMIN: 3.5 g/dL (ref 3.5–5.5)
ALT: 23 IU/L (ref 0–44)
AST (SGOT): 24 IU/L (ref 0–40)
Albumin/Globulin Ratio: 1.1 — ABNORMAL LOW (ref 1.2–2.2)
Alkaline Phosphatase, S: 74 IU/L (ref 39–117)
BUN / CREAT RATIO: 23 — AB (ref 9–20)
BUN: 20 mg/dL (ref 6–24)
Bilirubin Total: 0.2 mg/dL (ref 0.0–1.2)
CO2: 31 mmol/L — AB (ref 18–29)
CREATININE: 0.87 mg/dL (ref 0.76–1.27)
Calcium, Ser: 7 mg/dL — ABNORMAL LOW (ref 8.7–10.2)
Chloride, Ser: 103 mmol/L (ref 96–106)
GFR, EST AFRICAN AMERICAN: 109 mL/min/{1.73_m2} (ref >59)
GFR, EST NON AFRICAN AMERICAN: 94 mL/min/{1.73_m2} (ref >59)
GLUCOSE: 48 mg/dL — AB (ref 65–99)
Globulin, Total: 3.3 g/dL (ref 1.5–4.5)
Potassium, Ser: 3.2 mmol/L — ABNORMAL LOW (ref 3.5–5.2)
Sodium: 140 mmol/L (ref 134–144)
Total Protein: 6.8 g/dL (ref 6.0–8.5)

## 2017-01-21 LAB — CBC WITH DIFFERENTIAL (CANCER CENTER ONLY)
BASO#: 0 10*3/uL (ref 0.0–0.2)
BASO%: 0.5 % (ref 0.0–2.0)
EOS%: 2.4 % (ref 0.0–7.0)
Eosinophils Absolute: 0.1 10*3/uL (ref 0.0–0.5)
HCT: 32.2 % — ABNORMAL LOW (ref 38.7–49.9)
HEMOGLOBIN: 11.1 g/dL — AB (ref 13.0–17.1)
LYMPH#: 1 10*3/uL (ref 0.9–3.3)
LYMPH%: 16.6 % (ref 14.0–48.0)
MCH: 34.3 pg — ABNORMAL HIGH (ref 28.0–33.4)
MCHC: 34.5 g/dL (ref 32.0–35.9)
MCV: 99 fL — ABNORMAL HIGH (ref 82–98)
MONO#: 0.7 10*3/uL (ref 0.1–0.9)
MONO%: 11.7 % (ref 0.0–13.0)
NEUT%: 68.8 % (ref 40.0–80.0)
NEUTROS ABS: 4.1 10*3/uL (ref 1.5–6.5)
Platelets: 275 10*3/uL (ref 145–400)
RBC: 3.24 10*6/uL — ABNORMAL LOW (ref 4.20–5.70)
RDW: 11.8 % (ref 11.1–15.7)
WBC: 5.9 10*3/uL (ref 4.0–10.0)

## 2017-01-21 LAB — PROTIME-INR (CHCC SATELLITE)

## 2017-01-21 LAB — PROTHROMBIN TIME (PT)
INR: 5.8 — ABNORMAL HIGH (ref 0.8–1.2)
Prothrombin Time: 57.8 s — ABNORMAL HIGH (ref 9.1–12.0)

## 2017-01-21 NOTE — Progress Notes (Signed)
Hematology and Oncology Follow Up Visit  Rick Walsh [id number] Jul 09, 1956 61 y.o. 01/21/2017   Principle Diagnosis:  DVT of the right femoral, popliteal and calf veins  Current Therapy:   Coumadin 2.5 mg PO daily M, W, F, Sat, Sun and 5 mg PO daily T,Th    Interim History:  Rick Walsh is here today with his brother for follow-up. He is doing well and has no complaints at this time. He, his brother and sister are all moving to [place] permanently the second week of June. They are excited to be moving back close to their family.  He verbalized that he is taking his coumadin as prescribed. He has been eating spinach and had a spinach salad last night which he is concerned might effect his INR.  No fever, chills, n/v, cough, rash, dizziness, SOB, chest pain, palpitations, abdominal pain or changes in bowel or bladder habits.  No swelling, tenderness, numbness or tingling in his extremities. No c/o pain at this time.  They have been moving furniture and he hit his left leg with a table. He has a bruise that appears to be healing nicely. No hematoma, redness or edema.  He has maintained a good appetite and is staying well hydrated. His weight is stable.  He promises to get out of the car and walk every two hours on the drive to [place]. He will also make sure to stay well hydrated.   ECOG Performance Status: 1 - Symptomatic but completely ambulatory  Medications:  Allergies as of 01/21/2017      Reactions   Bee Venom Shortness Of Breath, Swelling   Phenytoin Sodium Nausea And Vomiting, Rash   MUST HAVE NAME BRAND DILANTIN      Medication List       Accurate as of 01/21/17  1:57 PM. Always use your most recent med list.          alendronate 70 MG tablet Commonly known as:  FOSAMAX Take 70 mg by mouth once a week. Take with a full glass of water on an empty stomach.   atenolol 25 MG tablet Commonly known as:  TENORMIN Take 25 mg by mouth daily.   cyanocobalamin 1000 MCG/ML  injection Commonly known as:  (VITAMIN B-12) Inject 1,000 mcg into the muscle every 30 (thirty) days.   levETIRAcetam 500 MG tablet Commonly known as:  KEPPRA Take 500 mg by mouth 2 (two) times daily.   multivitamin with minerals Tabs tablet Take 1 tablet by mouth daily.   phenytoin 100 MG ER capsule Commonly known as:  DILANTIN Take 200 mg by mouth 2 (two) times daily.   potassium chloride 10 MEQ CR capsule Commonly known as:  MICRO-K Take 10 mEq by mouth daily.   Vitamin D3 5000 units Caps Take 1 capsule by mouth daily.   warfarin 5 MG tablet Commonly known as:  COUMADIN TAKE 5 MG BY MOUTH DAILY AT 6 PM ON FRI., SUN., TUE., AND THURS. AND TAKE 2.5 MG DAILY ON SAT., MON., AND WED.       Allergies:  Allergies  Allergen Reactions  . Bee Venom Shortness Of Breath and Swelling  . Phenytoin Sodium Nausea And Vomiting and Rash    MUST HAVE NAME BRAND DILANTIN    Past Medical History, Surgical history, Social history, and Family History were reviewed and updated.  Review of Systems: All other 10 point review of systems is negative.   Physical Exam:  weight is 126 lb 1.9 oz (57.2 kg). His  oral temperature is 97.8 F (36.6 C). His blood pressure is 130/67 and his pulse is 58 (abnormal). His respiration is 16 and oxygen saturation is 100%.   Wt Readings from Last 3 Encounters:  01/21/17 126 lb 1.9 oz (57.2 kg)  12/17/16 126 lb 12.8 oz (57.5 kg)  11/19/16 130 lb 12.8 oz (59.3 kg)    Ocular: Sclerae unicteric, pupils equal, round and reactive to light Ear-nose-throat: Oropharynx clear, dentition fair Lymphatic: No cervical, supraclavicular or axillary adenopathy Lungs no rales or rhonchi, good excursion bilaterally Heart regular rate and rhythm, no murmur appreciated Abd soft, nontender, positive bowel sounds, no liver or spleen tip palpated on exam, no fluid wave MSK no focal spinal tenderness, no joint edema Neuro: non-focal, well-oriented, appropriate  affect Breasts: Deferred   Lab Results  Component Value Date   WBC 5.9 01/21/2017   HGB 11.1 (L) 01/21/2017   HCT 32.2 (L) 01/21/2017   MCV 99 (H) 01/21/2017   PLT 275 01/21/2017   No results found for: FERRITIN, IRON, TIBC, UIBC, IRONPCTSAT Lab Results  Component Value Date   RBC 3.24 (L) 01/21/2017   No results found for: KPAFRELGTCHN, LAMBDASER, KAPLAMBRATIO No results found for: IGGSERUM, IGA, IGMSERUM No results found for: Odetta Pink, SPEI   Chemistry      Component Value Date/Time   NA 140 01/14/2017 1337   NA 144 11/26/2016 1124   K 3.4 01/14/2017 1337   K 3.4 (L) 11/26/2016 1124   CL 101 01/14/2017 1337   CO2 29 01/14/2017 1337   CO2 30 (H) 11/26/2016 1124   BUN 23 (H) 01/14/2017 1337   BUN 23.2 11/26/2016 1124   CREATININE 1.2 01/14/2017 1337   CREATININE 0.8 11/26/2016 1124      Component Value Date/Time   CALCIUM 6.8 (L) 01/14/2017 1337   CALCIUM 6.8 (L) 11/26/2016 1124   ALKPHOS 74 01/14/2017 1337   ALKPHOS 79 11/26/2016 1124   AST 33 01/14/2017 1337   AST 29 11/26/2016 1124   ALT 37 01/14/2017 1337   ALT 25 11/26/2016 1124   BILITOT 0.40 01/14/2017 1337   BILITOT 0.34 11/26/2016 1124      Impression and Plan: Rick Walsh is a very pleasant 61 yo caucasian male with history of 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 He is currently on Coumadin and doing well. He has had no episodes of bleeding but does occasionally bruise.  His INR today was 5.8 so we will have him hold his coumadin for 3 days and then resume on his regular regimen.  We will plan to see him back next week for one more lab appointment before they move back to [place] permanently.  They will let us know where to send his records once  he has found a new hematologist in [place].  They will contact our office with any other questions or concerns.   Eliezer Bottom, NP 5/23/20181:57 PM

## 2017-01-22 ENCOUNTER — Telehealth: Payer: Self-pay | Admitting: Family

## 2017-01-22 NOTE — Telephone Encounter (Signed)
I spoke with the patient's brother bob and let him know the INR results and that the patient is to hold his Coumadin for 3 days and then resume his same regimen. Her verbalized understanding and we will recheck his INR in 1 weeks before he moves back to [place].

## 2017-01-28 ENCOUNTER — Other Ambulatory Visit: Payer: Medicare (Managed Care)

## 2017-01-28 ENCOUNTER — Other Ambulatory Visit (HOSPITAL_BASED_OUTPATIENT_CLINIC_OR_DEPARTMENT_OTHER): Payer: Managed Care, Other (non HMO)

## 2017-01-28 DIAGNOSIS — I82411 Acute embolism and thrombosis of right femoral vein: Secondary | ICD-10-CM

## 2017-01-28 LAB — CBC WITH DIFFERENTIAL (CANCER CENTER ONLY)
BASO#: 0 10*3/uL (ref 0.0–0.2)
BASO%: 0.5 % (ref 0.0–2.0)
EOS%: 1.4 % (ref 0.0–7.0)
Eosinophils Absolute: 0.1 10*3/uL (ref 0.0–0.5)
HCT: 31.4 % — ABNORMAL LOW (ref 38.7–49.9)
HGB: 10.7 g/dL — ABNORMAL LOW (ref 13.0–17.1)
LYMPH#: 0.8 10*3/uL — AB (ref 0.9–3.3)
LYMPH%: 9.5 % — AB (ref 14.0–48.0)
MCH: 34 pg — ABNORMAL HIGH (ref 28.0–33.4)
MCHC: 34.1 g/dL (ref 32.0–35.9)
MCV: 100 fL — ABNORMAL HIGH (ref 82–98)
MONO#: 0.8 10*3/uL (ref 0.1–0.9)
MONO%: 9.5 % (ref 0.0–13.0)
NEUT#: 6.7 10*3/uL — ABNORMAL HIGH (ref 1.5–6.5)
NEUT%: 79.1 % (ref 40.0–80.0)
PLATELETS: 298 10*3/uL (ref 145–400)
RBC: 3.15 10*6/uL — ABNORMAL LOW (ref 4.20–5.70)
RDW: 11.9 % (ref 11.1–15.7)
WBC: 8.4 10*3/uL (ref 4.0–10.0)

## 2017-01-28 LAB — PROTIME-INR (CHCC SATELLITE)
INR: 1.8 — ABNORMAL LOW (ref 2.0–3.5)
Protime: 21.6 Seconds — ABNORMAL HIGH (ref 10.6–13.4)

## 2017-01-28 LAB — COMPREHENSIVE METABOLIC PANEL (CC13)
A/G RATIO: 1.2 (ref 1.2–2.2)
ALT: 24 IU/L (ref 0–44)
AST: 26 IU/L (ref 0–40)
Albumin, Serum: 3.5 g/dL (ref 3.5–5.5)
Alkaline Phosphatase, S: 72 IU/L (ref 39–117)
BILIRUBIN TOTAL: 0.2 mg/dL (ref 0.0–1.2)
BUN/Creatinine Ratio: 24 — ABNORMAL HIGH (ref 9–20)
BUN: 23 mg/dL (ref 6–24)
CALCIUM: 6.8 mg/dL — AB (ref 8.7–10.2)
CHLORIDE: 106 mmol/L (ref 96–106)
Carbon Dioxide, Total: 29 mmol/L (ref 18–29)
Creatinine, Ser: 0.97 mg/dL (ref 0.76–1.27)
GFR, EST AFRICAN AMERICAN: 98 mL/min/{1.73_m2} (ref >59)
GFR, EST NON AFRICAN AMERICAN: 85 mL/min/{1.73_m2} (ref >59)
GLOBULIN, TOTAL: 2.9 g/dL (ref 1.5–4.5)
Glucose: 84 mg/dL (ref 65–99)
POTASSIUM: 3.4 mmol/L — AB (ref 3.5–5.2)
SODIUM: 141 mmol/L (ref 134–144)
Total Protein: 6.4 g/dL (ref 6.0–8.5)

## 2017-01-30 ENCOUNTER — Telehealth: Payer: Self-pay | Admitting: *Deleted

## 2017-01-30 NOTE — Telephone Encounter (Addendum)
Patient's brother aware of results and NO dosing adjustments  ----- Message from Eliezer Bottom, NP sent at 01/30/2017 10:23 AM EDT ----- Regarding: INR I spoke with Dr. Marin Olp, no change to his coumadin regimen. Thank you!  Sarah  ----- Message ----- From: Interface, Lab In Three Zero One Sent: 01/28/2017   1:40 PM To: Eliezer Bottom, NP

## 2017-02-04 ENCOUNTER — Other Ambulatory Visit: Payer: Managed Care, Other (non HMO)

## 2017-02-11 ENCOUNTER — Other Ambulatory Visit: Payer: Managed Care, Other (non HMO)

## 2017-02-18 ENCOUNTER — Other Ambulatory Visit: Payer: Medicare (Managed Care)

## 2017-02-25 ENCOUNTER — Other Ambulatory Visit: Payer: Medicare (Managed Care)

## 2017-03-11 ENCOUNTER — Other Ambulatory Visit: Payer: Medicare (Managed Care)

## 2018-01-17 IMAGING — CR DG HIP (WITH OR WITHOUT PELVIS) 2-3V*R*
3 series · 3 of 3 positions shown · non-contrast
Comparison: No recent.

CLINICAL DATA: Pain.

EXAM:
DG HIP (WITH OR WITHOUT PELVIS) 2-3V RIGHT

[t pelvis a.p.]
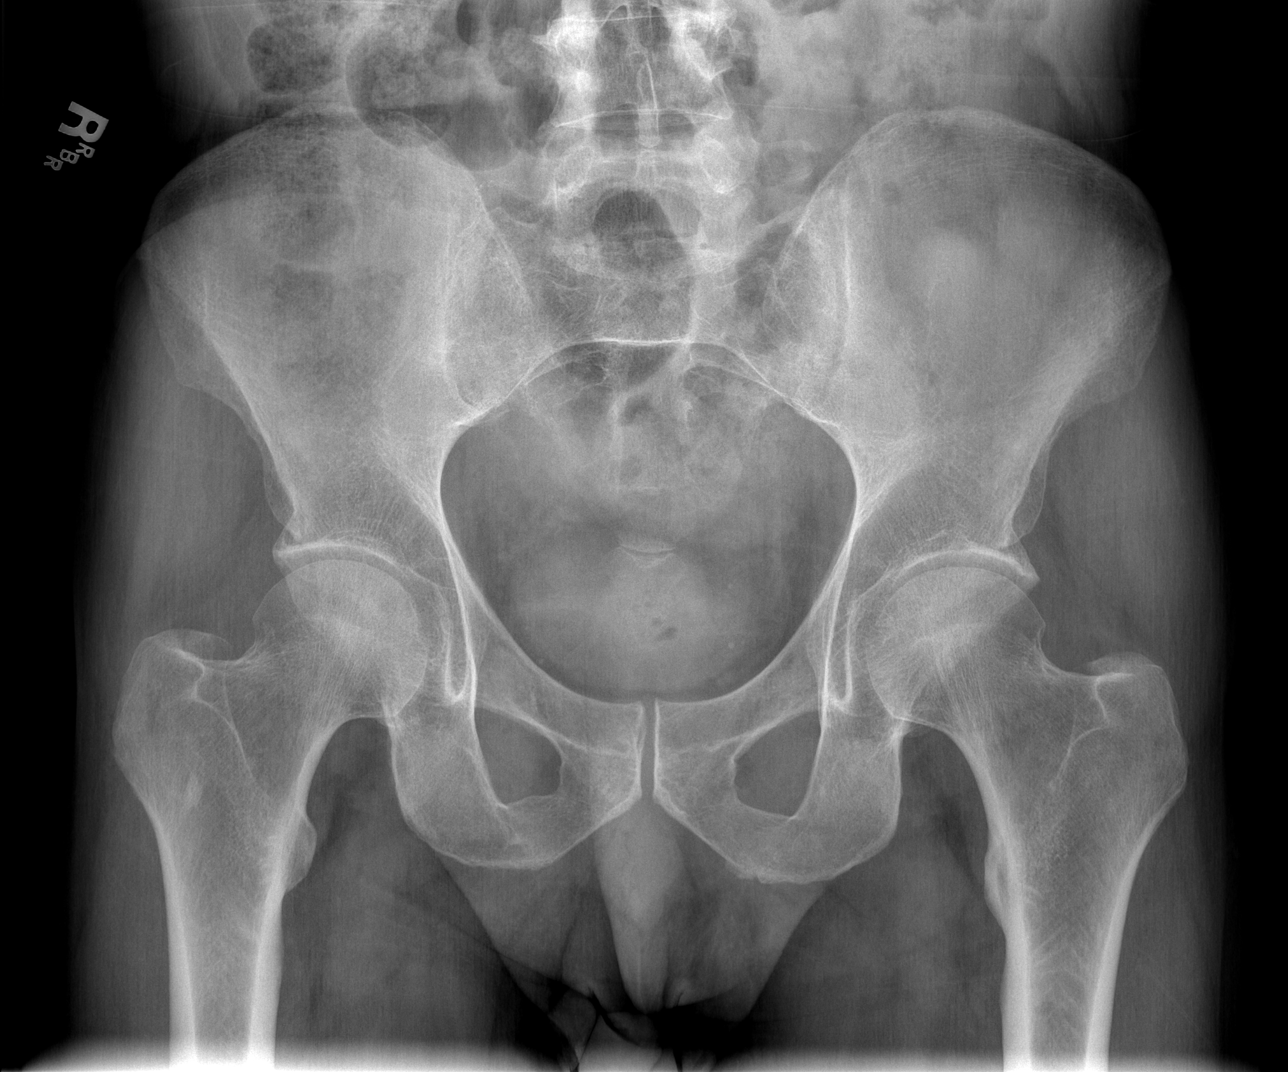

[t hip ap right]
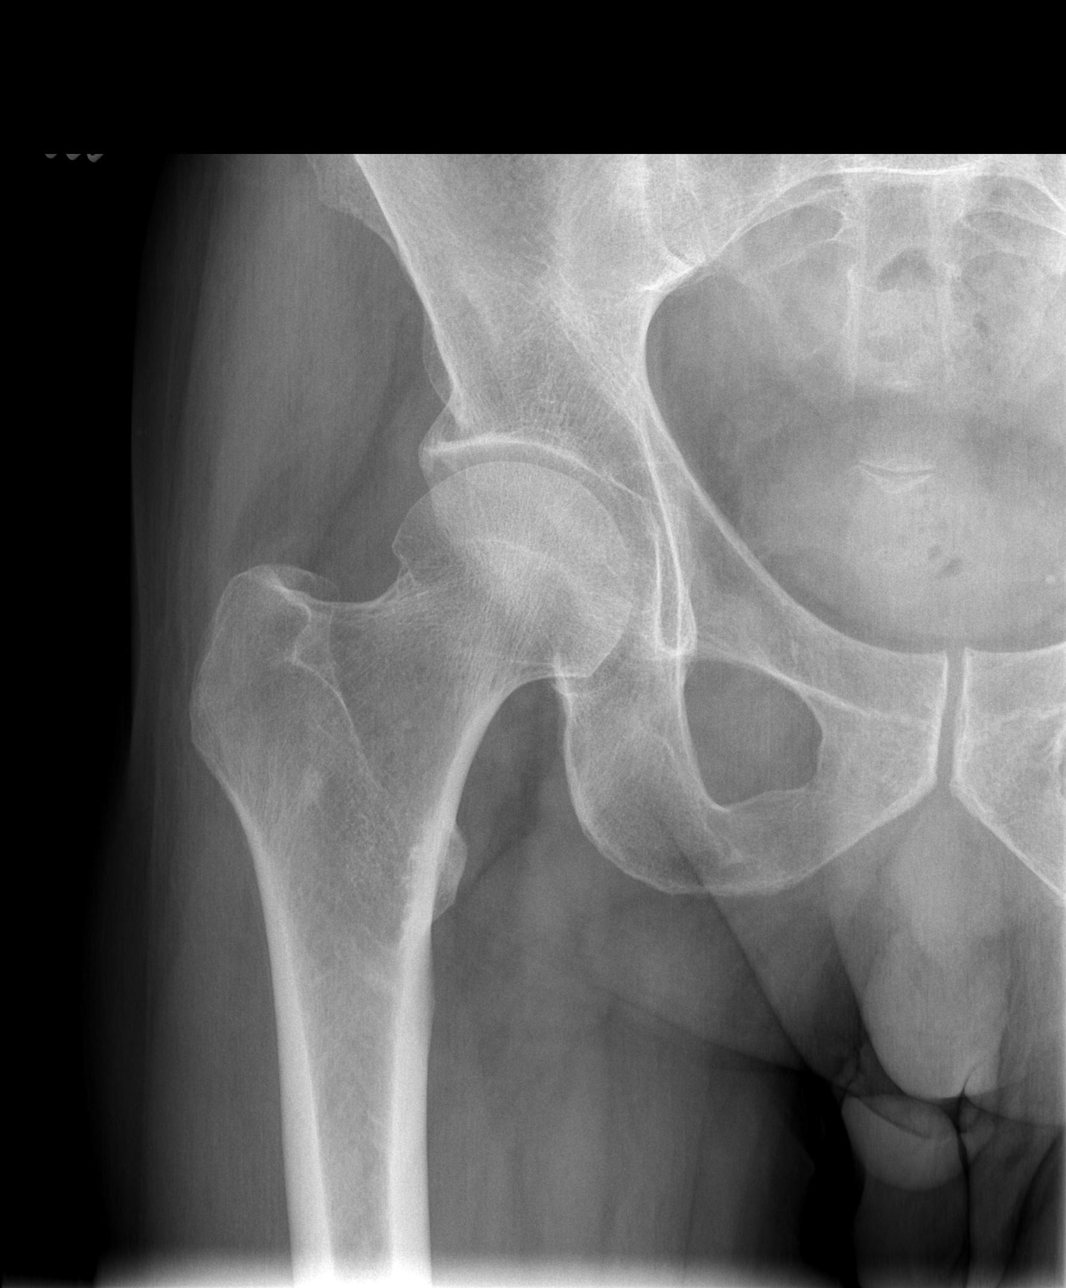

[t hip frog leg right]
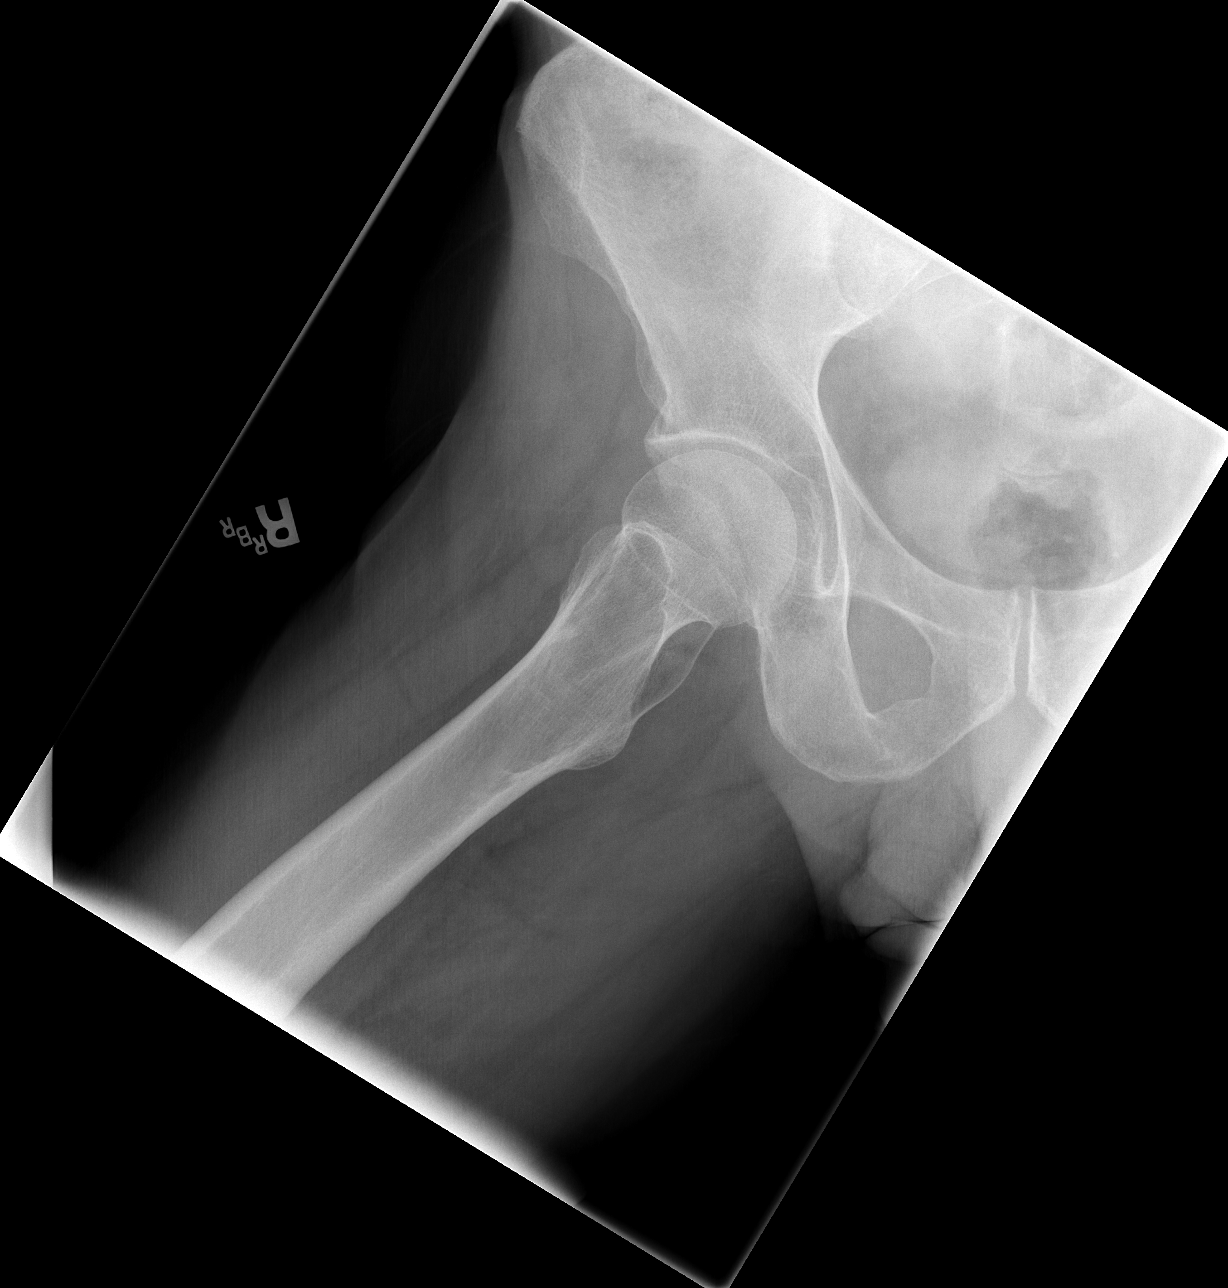

[3 of 3 positions shown; findings below may reference images not displayed]

FINDINGS: No acute bony abnormality identified. Degenerative changes lumbar
spine and both hips. No evidence of fracture or dislocation. Tiny
sclerotic focus in the proximal right femur, most likely bone
island. Pelvic calcifications consistent phleboliths.
IMPRESSION: No acute bony abnormality. Degenerative changes lumbar spine and
both hips.

## 2018-04-03 IMAGING — US US EXTREM LOW VENOUS*R*
1 series · 13 of 24 positions shown · non-contrast
Comparison: Right lower extremity Venous Doppler ultrasound -
04/22/2016.

CLINICAL DATA: History of DVT.  Evaluate for acute or chronic DVT.



[Series 1: us extrem low venous*right* · 0.06mm/px · 13 of 42 slices shown]
[im 1/42]
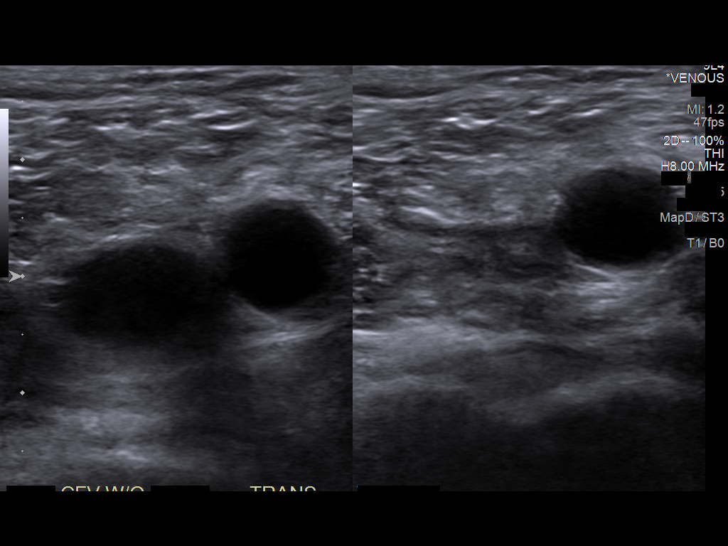
[im 4/42]
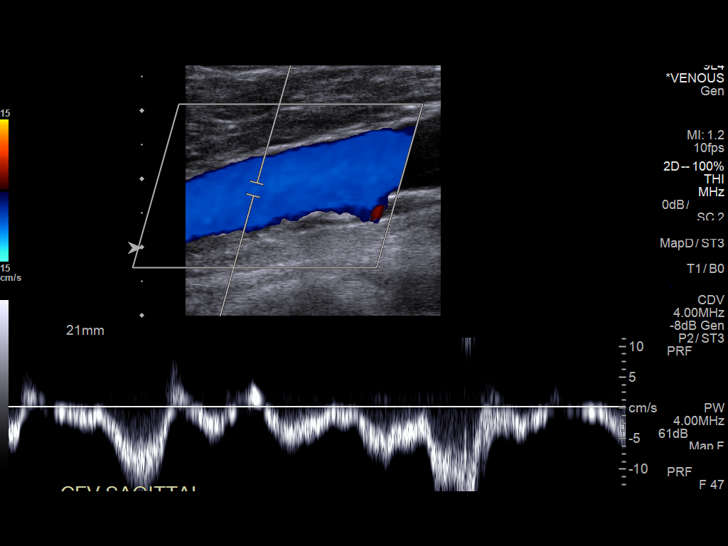
[im 8/42]
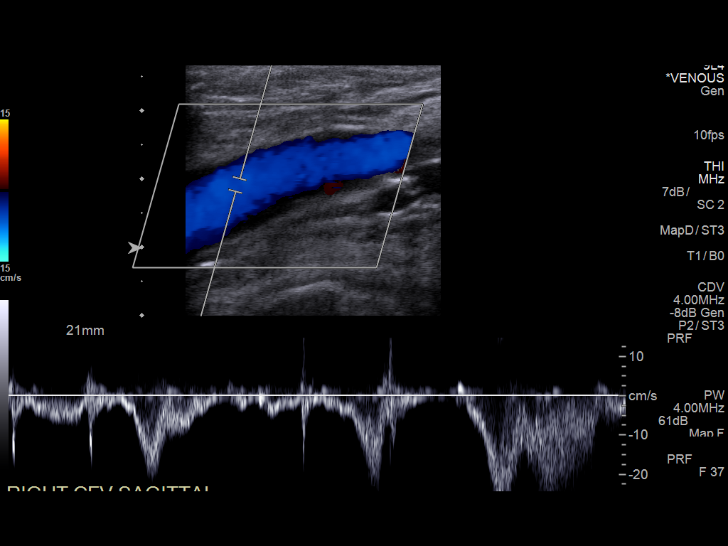
[im 11/42]
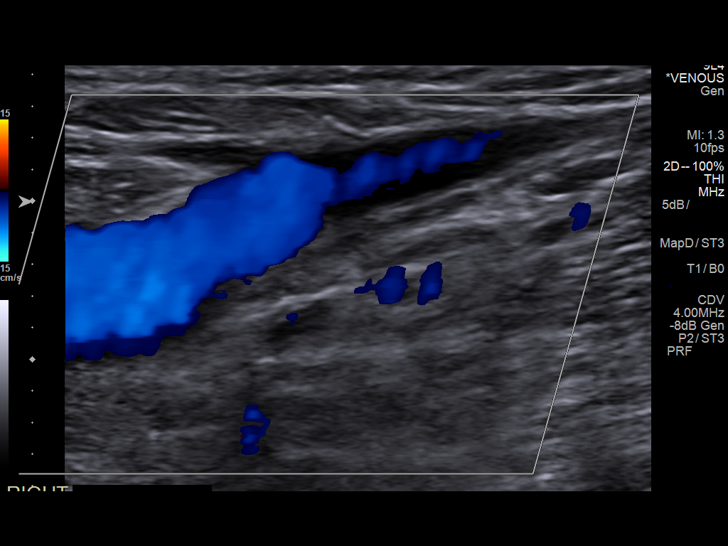
[im 15/42]
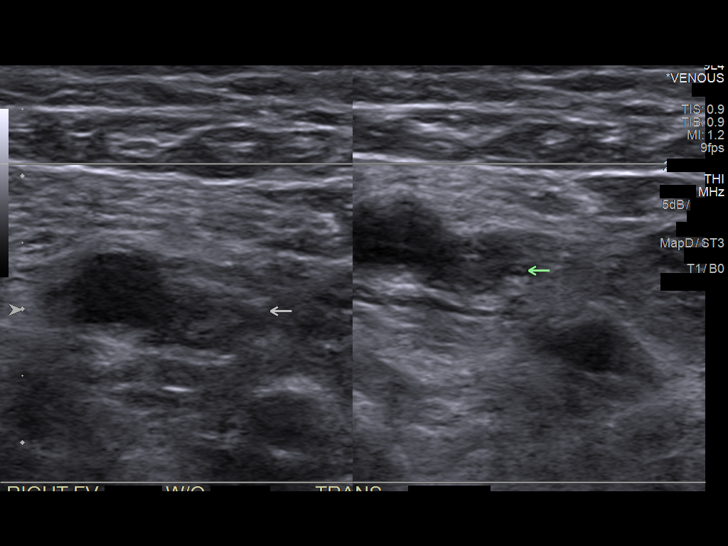
[im 18/42]
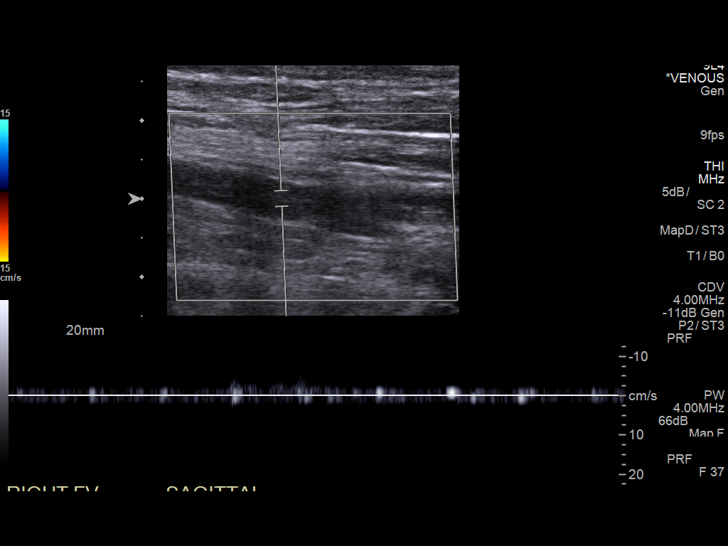
[im 22/42]
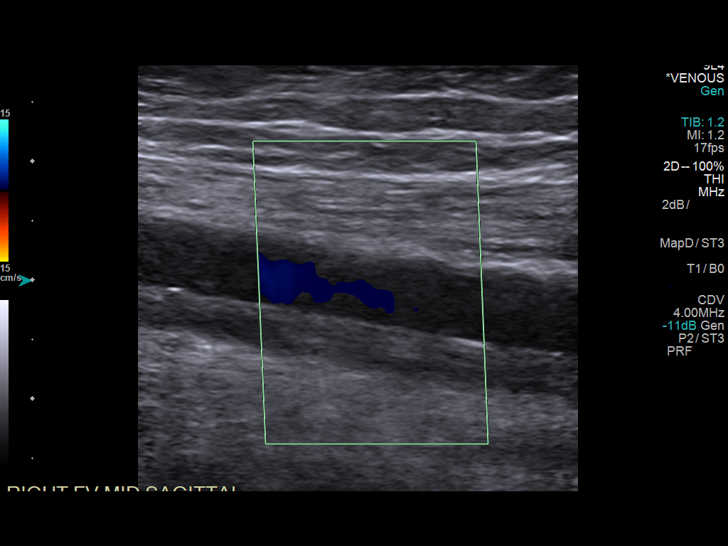
[im 24/42]
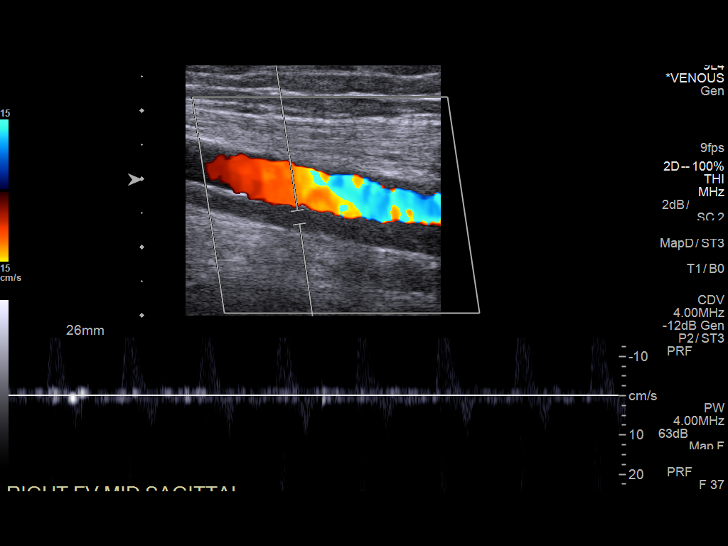
[im 27/42]
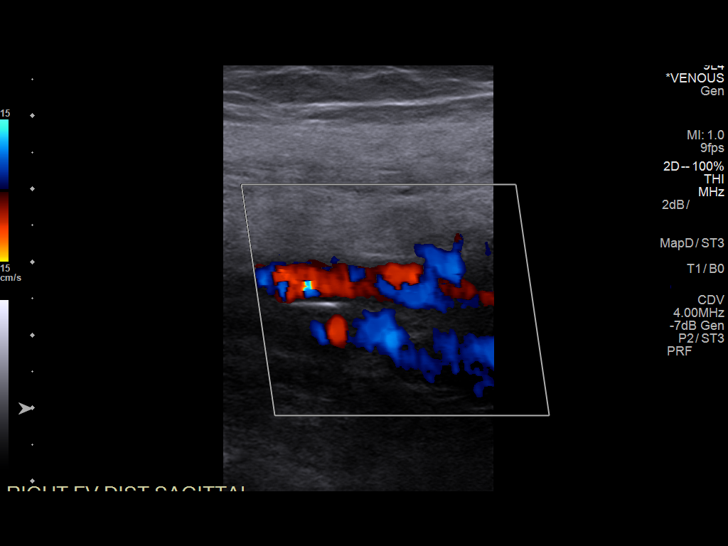
[im 31/42]
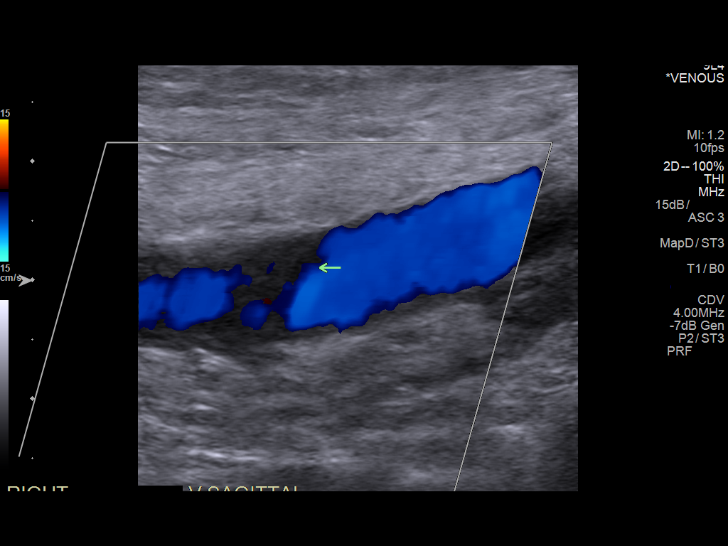
[im 34/42]
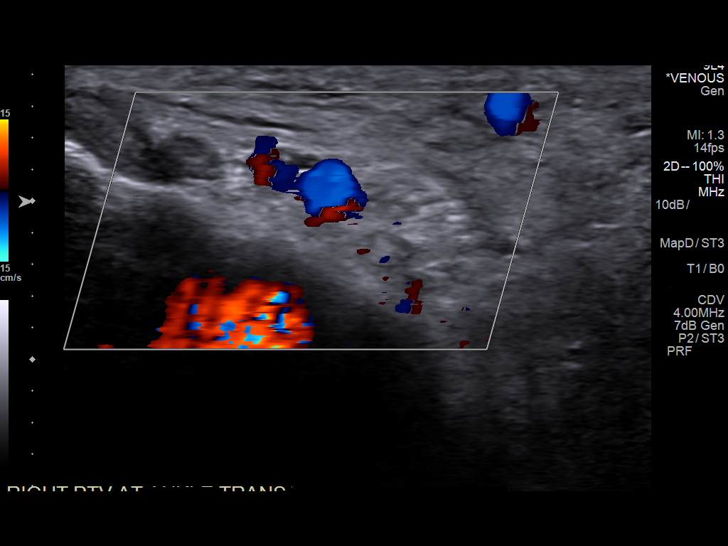
[im 38/42]
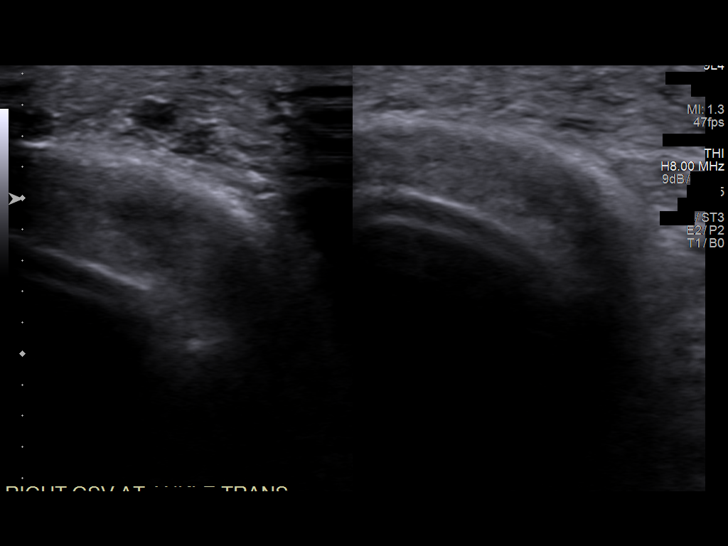
[im 42/42]
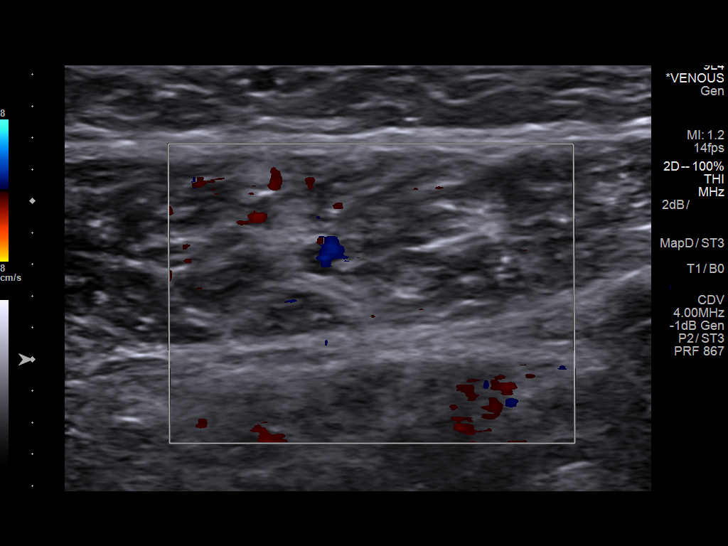

[13 of 24 positions shown; findings below may reference images not displayed]

FINDINGS: Contralateral Common Femoral Vein: Respiratory phasicity is normal
and symmetric with the symptomatic side. No evidence of thrombus.
Normal compressibility.

Common Femoral Vein: No evidence of thrombus. Normal
compressibility, respiratory phasicity and response to augmentation.

Saphenofemoral Junction: No evidence of thrombus. Normal
compressibility and flow on color Doppler imaging.

Profunda Femoral Vein: No evidence of thrombus. Normal
compressibility and flow on color Doppler imaging.

There is hypoechoic occlusive the the this 33 thrombus throughout
the right femoral vein (representative images 17, 22 and 27).

There is a very minimal amount of recanalized flow seen within the
right popliteal vein (representative image 33), minimally improved
compared to the [DATE] examination.

Calf Veins: Hypoechoic occlusive thrombus within in 1 of the paired
peroneal veins. The right posterior tibial vein appears patent where
imaged.

Superficial Great Saphenous Vein: No evidence of thrombus. Normal
compressibility and flow on color Doppler imaging.

Other Findings:  None.
IMPRESSION: 1. No definite evidence of acute DVT within the right lower
extremity.
2. Examination is positive chronic occlusive DVT throughout the
right superficial femoral artery as well as within 1 of the paired
peroneal veins, similar to the [DATE] examination.
3. Examination is positive for chronic nonocclusive DVT within the
right popliteal vein, minimally improved compared to the 04/22/2016
examination.
